# Patient Record
Sex: Female | Born: 1965 | Race: White | Hispanic: No | Marital: Single | Smoking: Current every day smoker
Health system: Southern US, Community
[De-identification: ages and names within clinical notes are randomized; demographics above are authoritative.]

## PROBLEM LIST (undated history)

## (undated) DIAGNOSIS — F329 Major depressive disorder, single episode, unspecified: Secondary | ICD-10-CM

## (undated) DIAGNOSIS — N3010 Interstitial cystitis (chronic) without hematuria: Secondary | ICD-10-CM

## (undated) DIAGNOSIS — F319 Bipolar disorder, unspecified: Secondary | ICD-10-CM

## (undated) DIAGNOSIS — F32A Depression, unspecified: Secondary | ICD-10-CM

## (undated) HISTORY — DX: Major depressive disorder, single episode, unspecified: F32.9

## (undated) HISTORY — PX: FEMUR FRACTURE SURGERY: SHX633

## (undated) HISTORY — DX: Depression, unspecified: F32.A

## (undated) HISTORY — DX: Interstitial cystitis (chronic) without hematuria: N30.10

## (undated) HISTORY — PX: ABDOMINAL HYSTERECTOMY: SHX81

## (undated) HISTORY — DX: Bipolar disorder, unspecified: F31.9

## (undated) HISTORY — PX: TONSILLECTOMY: SUR1361

---

## 1998-08-07 ENCOUNTER — Encounter: Payer: Self-pay | Admitting: Orthopedic Surgery

## 1998-08-08 ENCOUNTER — Ambulatory Visit (HOSPITAL_COMMUNITY): Admission: RE | Admit: 1998-08-08 | Discharge: 1998-08-08 | Payer: Self-pay | Admitting: Orthopedic Surgery

## 1999-12-13 ENCOUNTER — Other Ambulatory Visit: Admission: RE | Admit: 1999-12-13 | Discharge: 1999-12-13 | Payer: Self-pay | Admitting: Gynecology

## 2000-05-06 ENCOUNTER — Encounter: Payer: Self-pay | Admitting: Family Medicine

## 2000-05-06 ENCOUNTER — Ambulatory Visit (HOSPITAL_COMMUNITY): Admission: RE | Admit: 2000-05-06 | Discharge: 2000-05-06 | Payer: Self-pay | Admitting: Family Medicine

## 2004-11-04 HISTORY — PX: CHOLECYSTECTOMY: SHX55

## 2006-09-01 ENCOUNTER — Emergency Department (HOSPITAL_COMMUNITY): Admission: EM | Admit: 2006-09-01 | Discharge: 2006-09-01 | Payer: Self-pay | Admitting: Emergency Medicine

## 2006-09-17 ENCOUNTER — Emergency Department (HOSPITAL_COMMUNITY): Admission: EM | Admit: 2006-09-17 | Discharge: 2006-09-18 | Payer: Self-pay | Admitting: Emergency Medicine

## 2006-09-28 ENCOUNTER — Inpatient Hospital Stay (HOSPITAL_COMMUNITY): Admission: EM | Admit: 2006-09-28 | Discharge: 2006-10-02 | Payer: Self-pay | Admitting: Emergency Medicine

## 2006-09-29 ENCOUNTER — Ambulatory Visit: Payer: Self-pay | Admitting: Infectious Diseases

## 2006-10-02 ENCOUNTER — Encounter (INDEPENDENT_AMBULATORY_CARE_PROVIDER_SITE_OTHER): Payer: Self-pay | Admitting: *Deleted

## 2006-10-02 ENCOUNTER — Encounter (INDEPENDENT_AMBULATORY_CARE_PROVIDER_SITE_OTHER): Payer: Self-pay | Admitting: Otolaryngology

## 2006-10-20 ENCOUNTER — Emergency Department (HOSPITAL_COMMUNITY): Admission: EM | Admit: 2006-10-20 | Discharge: 2006-10-20 | Payer: Self-pay | Admitting: Emergency Medicine

## 2007-02-12 ENCOUNTER — Emergency Department (HOSPITAL_COMMUNITY): Admission: EM | Admit: 2007-02-12 | Discharge: 2007-02-12 | Payer: Self-pay | Admitting: Emergency Medicine

## 2007-03-02 ENCOUNTER — Emergency Department (HOSPITAL_COMMUNITY): Admission: EM | Admit: 2007-03-02 | Discharge: 2007-03-02 | Payer: Self-pay | Admitting: Emergency Medicine

## 2007-03-19 ENCOUNTER — Emergency Department (HOSPITAL_COMMUNITY): Admission: EM | Admit: 2007-03-19 | Discharge: 2007-03-19 | Payer: Self-pay | Admitting: Emergency Medicine

## 2007-11-28 ENCOUNTER — Emergency Department (HOSPITAL_COMMUNITY): Admission: EM | Admit: 2007-11-28 | Discharge: 2007-11-28 | Payer: Self-pay | Admitting: Emergency Medicine
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
## 2011-03-22 NOTE — Op Note (Signed)
[name] [name], [name] [name]               ACCOUNT NO.:  [id number]   MEDICAL RECORD NO.:  [id number]          PATIENT TYPE:  INP   LOCATION:  5735                         FACILITY:  [facility]   PHYSICIAN:  Antony Contras, MD     DATE OF BIRTH:  1966-01-05   DATE OF PROCEDURE:  09/30/2006  DATE OF DISCHARGE:                               OPERATIVE REPORT   PREOPERATIVE DIAGNOSIS:  Right tongue base mass.   POSTOPERATIVE DIAGNOSIS:  Right tongue base mass.   PROCEDURE:  Fiberoptic nasopharyngoscopy.   SURGEON:  Dr. Christia Reading.   ANESTHESIA:  Topical.   COMPLICATIONS:  None.   INDICATIONS:  The patient is a 45 year old white female with a several-  week history of trismus and jaw pain associated with sore throat who was  found to have a right tongue base mass by CT scan.  Evaluation is being  performed by fiberoptic examination due to gag and poor visualization  through the mouth.   FINDINGS:  The right tongue base is more prominent than the left  protruding out into the pharynx somewhat.  There is no ulceration of the  tongue base.  The pharyngeal airway is widely patent.  Tongue mobility  is normal.  Laryngeal examination shows normal vocal cord movement and  no mass felt on the larynx or hypopharynx.   DESCRIPTION OF PROCEDURE:  The patient was identified in the hospital  room and informed consent having been obtained, the right nose was  sprayed with topical lidocaine.  The fiberoptic laryngoscope was then  inserted through the right nasal passage and used to view the  nasopharynx, pharynx, and larynx.  Findings noted above.  After this,  the laryngoscope was removed and the patient returned to nursing care.      Antony Contras, MD  Electronically Signed     DDB/MEDQ  D:  09/30/2006  T:  10/01/2006  Job:  [id number]

## 2011-03-22 NOTE — Consult Note (Signed)
NAMESHERRE, WOOTON               ACCOUNT NO.:  [id number]   MEDICAL RECORD NO.:  [id number]          PATIENT TYPE:  INP   LOCATION:  5735                         FACILITY:  [facility]   PHYSICIAN:  Antony Contras, MD     DATE OF BIRTH:  14-Apr-1966   DATE OF CONSULTATION:  09/30/2006  DATE OF DISCHARGE:                                 CONSULTATION   REQUESTING SERVICE:  [facility], Hind IEda Paschal, MD   CHIEF COMPLAINT:  Jaw pain and trismus.   HISTORY OF PRESENT ILLNESS:  The patient is a 45 year old white female  who has 2-1/2-week history of jaw pain and difficulty opening the jaw.  She relates a story of falling 4-1/2 weeks ago over a rusty fence,  cutting her leg and foot.  She managed the wound and bandaged it and it  healed.  The jaw pain began about 2 weeks later on both sides her jaw  and her throat.  She felt swelling in her throat and her jaw locked 2  days later.  She also had cramping of her legs, feet, and arms, and her  neck hurt as well.  She with her doctor, who treated with a tetanus  booster and Rocephin and also an oral antibiotic.  The antibiotics  seemed to help some but did not get rid of the symptoms completely.  Because of continued pain and a difficulty opening her jaw, she  presented to the emergency department 2 days ago and was admitted to the  hospital.  She was initially treated for presumed tetanus but upon  further evaluation and laboratory testing is not felt to have tetanus.  She still has a hard time opening her jaw and it hurts and pulls in her  cheeks and jaw when she opens further.  She has swelling of her lymph  nodes in her neck.  Her throat has been sore for 2-1/2 weeks but not  severely.  Strep testing was negative.  She has no obstruction through  her throat to breathing or swallowing.  She has some soreness with  swallowing.  Her ears feel stopped up but are not painful.  Consultation  is requested following findings on the CT scan of the neck  performed  yesterday.   PAST MEDICAL HISTORY:  1. Bipolar disorder.  2. Kidney disease.   PAST SURGICAL HISTORY:  Tonsillectomy, cholecystectomy, hysterectomy,  and right knee surgery.   MEDICATIONS:  1. Rocephin.  2. Metronidazole.  3. Nicotine.  4. Zoloft.  5. Geodon.  6. Tylenol.  7. Benadryl.  8. Ativan.  9. Reglan.  10.Oxycodone.   ALLERGIES:  No known drug allergies.   FAMILY HISTORY:  Positive for hypertension, diabetes, heart disease,  osteoporosis, and cancer of the throat and liver or pancreas.   SOCIAL HISTORY:  The patient smokes a pack of cigarettes per day and has  for 22 years.  She denies marijuana or other drug use.  She drinks  occasional alcohol, about once per 2 weeks.   REVIEW OF SYSTEMS:  She has had some chest discomfort, feeling like she  cannot get  her breath.  Her kidneys were bothering her at admission with  blood and cloudiness of her urine, but that this has cleared.  She has  no other complaints.   PHYSICAL EXAMINATION:  VITAL SIGNS:  Afebrile.  Vital signs stable.  GENERAL:  The patient is alert and in no acute distress.  She is  pleasant and cooperative.  Voice is normal and she is and has no  respiratory distress.  HEENT:  Ears:  External ears are normal.  Nose:  External nose is  normal.  Nasal passages are patent.  Septum is relatively midline.  Eyes:  Extraocular movements are intact, and the pupils are equal, round  and reactive to light.  Mouth:  She has moderate trismus.  Buccal,  palatal and floor of mouth mucosa are all normal with no lesions.  The  dentition is good.  The oral tongue is normal and soft to palpation.  It  has normal movement.  As far back as can be palpated on the tongue is  normal.  NEUROLOGIC:  Cranial nerves II-XII are grossly intact.  NECK:  She has tender lymphadenopathy of the zone 2 region on both sides  with slightly enlarged lymph nodes.  Remainder of the neck is without  mass or tenderness.    RADIOLOGY:  A CT scan of the neck with contrast was performed on  November 26 and personally reviewed.  This demonstrates a2.7 x 1.5 cm  mass of the right tongue base, extending into the vallecula, with  possible erosion of the hyoid bone slightly on the right side.  Enlarged  lymph nodes were seen in zone 2 on both sides with the right-sided lymph  node measuring 15 x 10 mm.   ASSESSMENT:  The patient is a 45 year old white female with jaw pain and  trismus and a tongue base mass by CT scan.   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  To do a more thorough  examination and perform a biopsy, she will be scheduled for panendoscopy  under general anesthesia with a plan of a biopsy being performed.  This  is scheduled for November 29 at 1:30 in the afternoon.  Treatment  options will depend on the results of this examination.  Prior to this,  a fiberoptic examination will be performed to get a better idea of what  could be occurring with the tongue base.      Antony Contras, MD  Electronically Signed     DDB/MEDQ  D:  09/30/2006  T:  10/01/2006  Job:  [id number]
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
## 2011-03-22 NOTE — Op Note (Signed)
[name] [name], [name] [name]               ACCOUNT NO.:  [id number]   MEDICAL RECORD NO.:  [id number]          PATIENT TYPE:  INP   LOCATION:  5735                         FACILITY:  [facility]   PHYSICIAN:  Antony Contras, MD     DATE OF BIRTH:  22-Oct-1966   DATE OF PROCEDURE:  10/02/2006  DATE OF DISCHARGE:  10/02/2006                               OPERATIVE REPORT   PREOPERATIVE DIAGNOSIS:  Right tongue base mass.   POSTOPERATIVE DIAGNOSIS:  Right tongue base mass.   PROCEDURE:  1. Direct laryngoscopy with biopsy.  2. Rigid bronchoscopy.  3. Rigid esophagoscopy.   SURGEON:  Excell Seltzer. Jenne Pane, M.D.   ANESTHESIA:  General endotracheal anesthesia.   COMPLICATIONS:  None.   INDICATIONS:  The patient is a 45 year old white female who was admitted  to the hospital a few days ago because of trismus and concern for  tetanus.  In evaluation of other possible causes for her trismus, a CT  scan was performed of the face and neck and demonstrates a 2.7 x 1.5 cm  right tongue base mass.  A fiberoptic examination shows prominence of  the right tongue base.  She presents to the operating room for  panendoscopy with biopsy.   FINDINGS:  The patient was found have a prominent right tongue base  without ulceration.  By palpation, it was felt a bit firm but not rock  hard.  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  Deeper biopsies were performed of the prominent  area.  Frozen section initially showed tonsil tissue.  Some will be sent  for lymphoma protocol, as well.  Upon bronchoscopy, an accessory right  bronchus was noted.  The remainder of the bronchoscopy was normal.  Upon  esophagoscopy, the esophagus was normal.   DESCRIPTION OF PROCEDURE:  The patient was identified in the holding  room, informed consent having been obtained, the patient was taken to  the operating suite and placed on the operating table in the supine  position.  Anesthesia was  induced and the patient was turned 90 degrees  from anesthesia under mask ventilation.  The eyes were taped closed and  a tooth guard was placed.  The larynx was exposed using a #2 Miller  laryngoscope.  A large rigid 0 degree telescope was inserted through the  laryngoscope and between the glottis.  It was used to then evaluate the  trachea as well as both primary bronchi and the takeoff of the secondary  bronchi.  Findings were noted above.  The telescope was then removed and  then a 7 endotracheal tube was inserted without difficulty through the  glottis.  The cuff was inflated and attached to the anesthesia circuit  and the patient successfully ventilated.  This was taped to the left  side of the mouth.  At this point, the larynx and pharynx were carefully  examined with the anterior commissure laryngoscope including the tongue  base, vallecula, piriform sinuses, hypopharynx, and the larynx.  Findings are noted above.  Several biopsies were then taken from  the  right tongue base using cup forceps.  The laryngoscope was then removed.  A #7 rigid esophagoscope was then carefully inserted through the mouth  and into the upper esophagus.  It was then passed down the esophagus  keeping the lumen in view.  It was then gradually backed out slowly,  examining the esophagus for 360 degrees.  After this, the frozen section  results returned as tonsillar tissue.  Thus, the right tongue base was  again exposed with the laryngoscope and blood suctioned out.  Additional  biopsies were then taken and some sent for frozen section and some held  aside for flow cytometry.  The throat was then suctioned out again and  the laryngoscope removed.  The tooth guard was removed and the patient  was turned back to anesthesia for wake-up.  She was extubated and moved  to the recovery room in stable condition.      Antony Contras, MD  Electronically Signed     DDB/MEDQ  D:  10/02/2006  T:  10/02/2006   Job:  [id number]

## 2015-09-08 ENCOUNTER — Encounter: Payer: Self-pay | Admitting: Family Medicine

## 2015-09-08 ENCOUNTER — Ambulatory Visit (INDEPENDENT_AMBULATORY_CARE_PROVIDER_SITE_OTHER): Payer: No Typology Code available for payment source | Admitting: Family Medicine

## 2015-09-08 VITALS — BP 128/81 | HR 86 | Temp 97.5°F | Ht 62.0 in | Wt 155.0 lb

## 2015-09-08 DIAGNOSIS — B3731 Acute candidiasis of vulva and vagina: Secondary | ICD-10-CM

## 2015-09-08 DIAGNOSIS — Z720 Tobacco use: Secondary | ICD-10-CM

## 2015-09-08 DIAGNOSIS — A499 Bacterial infection, unspecified: Secondary | ICD-10-CM

## 2015-09-08 DIAGNOSIS — B373 Candidiasis of vulva and vagina: Secondary | ICD-10-CM

## 2015-09-08 DIAGNOSIS — J209 Acute bronchitis, unspecified: Secondary | ICD-10-CM | POA: Diagnosis not present

## 2015-09-08 DIAGNOSIS — R350 Frequency of micturition: Secondary | ICD-10-CM | POA: Diagnosis not present

## 2015-09-08 DIAGNOSIS — N760 Acute vaginitis: Secondary | ICD-10-CM

## 2015-09-08 DIAGNOSIS — B9689 Other specified bacterial agents as the cause of diseases classified elsewhere: Secondary | ICD-10-CM

## 2015-09-08 DIAGNOSIS — Z716 Tobacco abuse counseling: Secondary | ICD-10-CM | POA: Diagnosis not present

## 2015-09-08 DIAGNOSIS — F319 Bipolar disorder, unspecified: Secondary | ICD-10-CM | POA: Insufficient documentation

## 2015-09-08 LAB — POCT URINALYSIS DIPSTICK
Bilirubin, UA: NEGATIVE
GLUCOSE UA: NEGATIVE
Ketones, UA: NEGATIVE
Leukocytes, UA: NEGATIVE
NITRITE UA: NEGATIVE
PROTEIN UA: NEGATIVE
RBC UA: NEGATIVE
Spec Grav, UA: 1.02
UROBILINOGEN UA: NEGATIVE
pH, UA: 6.5

## 2015-09-08 LAB — POCT UA - MICROSCOPIC ONLY
Casts, Ur, LPF, POC: NEGATIVE
Crystals, Ur, HPF, POC: NEGATIVE
RBC, URINE, MICROSCOPIC: NEGATIVE
WBC, Ur, HPF, POC: NEGATIVE
Yeast, UA: NEGATIVE

## 2015-09-08 MED ORDER — METRONIDAZOLE 500 MG PO TABS: 500.0000 mg | ORAL_TABLET | Freq: Two times a day (BID) | ORAL | Status: DC

## 2015-09-08 MED ORDER — ALBUTEROL SULFATE HFA 108 (90 BASE) MCG/ACT IN AERS: 2.0000 | INHALATION_SPRAY | Freq: Four times a day (QID) | RESPIRATORY_TRACT | Status: DC | PRN

## 2015-09-08 MED ORDER — FLUCONAZOLE 150 MG PO TABS: 150.0000 mg | ORAL_TABLET | Freq: Once | ORAL | Status: DC

## 2015-09-08 MED ORDER — BUPROPION HCL ER (XL) 150 MG PO TB24: 150.0000 mg | ORAL_TABLET | Freq: Every day | ORAL | Status: DC

## 2015-09-08 NOTE — Progress Notes (Signed)
BP 128/81 mmHg  Pulse 86  Temp(Src) 97.5 F (36.4 C) (Oral)  Ht 5\' 2"  (1.575 m)  Wt 155 lb (70.308 kg)  BMI 28.34 kg/m2   Subjective:    Patient ID: [name] [name], female    DOB: 26-Oct-1966, 49 y.o.   MRN: [id number]  HPI: [name] [name] is a 49 y.o. female presenting on 09/08/2015 for Urinary Tract Infection; Vaginitis; and Medication Refill   HPI Dysuria Patient has been having dysuria and suprapubic abdominal pressure that began 2 weeks ago and has been worsening over the past couple days. She is also been complaining of some vaginal odor and discharge and took some home Diflucan but she is also had BV previously. She denies fevers or chills, she denies any flank pain, she denies any nausea or vomiting.  Relevant past medical, surgical, family and social history reviewed and updated as indicated. Interim medical history since our last visit reviewed. Allergies and medications reviewed and updated.  Review of Systems  Constitutional: Negative for fever and chills.  HENT: Negative for congestion, ear discharge and ear pain.   Eyes: Negative for redness and visual disturbance.  Respiratory: Negative for chest tightness and shortness of breath.   Cardiovascular: Negative for chest pain and leg swelling.  Gastrointestinal: Positive for abdominal pain. Negative for abdominal distention.  Genitourinary: Positive for dysuria, urgency, frequency and vaginal discharge (with vaginal odor). Negative for hematuria, flank pain, decreased urine volume, vaginal bleeding, difficulty urinating, vaginal pain, menstrual problem and pelvic pain.  Musculoskeletal: Negative for back pain and gait problem.  Skin: Negative for rash.  Neurological: Negative for dizziness, light-headedness and headaches.  Psychiatric/Behavioral: Negative for behavioral problems and agitation.  All other systems reviewed and are negative.   Per HPI unless specifically indicated above  Social History    Social History  . Marital Status: Single    Spouse Name: N/A  . Number of Children: N/A  . Years of Education: N/A   Occupational History  . Not on file.   Social History Main Topics  . Smoking status: Current Every Day Smoker -- 0.50 packs/day for 33 years    Types: Cigarettes  . Smokeless tobacco: Never Used  . Alcohol Use: 0.0 oz/week    0 Standard drinks or equivalent per week     Comment: once per month  . Drug Use: No  . Sexual Activity: Yes    Birth Control/ Protection: Condom     Comment: boyfriend   Other Topics Concern  . Not on file   Social History Narrative  . No narrative on file    Past Surgical History  Procedure Laterality Date  . Abdominal hysterectomy    . Femur fracture surgery    . Tonsillectomy    . Cholecystectomy  2006    Family History  Problem Relation Age of Onset  . Hypertension Mother   . Hypertension Father   . Heart disease Father   . Diabetes Father   . Bipolar disorder Daughter   . ADD / ADHD Son       Medication List       This list is accurate as of: 09/08/15  2:39 PM.  Always use your most recent med list.               albuterol 108 (90 BASE) MCG/ACT inhaler  Commonly known as:  PROVENTIL HFA;VENTOLIN HFA  Inhale 2 puffs into the lungs every 6 (six) hours as needed for wheezing or shortness of  breath.     buPROPion 150 MG 24 hr tablet  Commonly known as:  WELLBUTRIN XL  Take 1 tablet (150 mg total) by mouth daily.     fluconazole 150 MG tablet  Commonly known as:  DIFLUCAN  Take 1 tablet (150 mg total) by mouth once.     metroNIDAZOLE 500 MG tablet  Commonly known as:  FLAGYL  Take 1 tablet (500 mg total) by mouth 2 (two) times daily.           Objective:    BP 128/81 mmHg  Pulse 86  Temp(Src) 97.5 F (36.4 C) (Oral)  Ht  (1.575 m)  Wt 155 lb (70.308 kg)  BMI 28.34 kg/m2  Wt Readings from Last 3 Encounters:  09/08/15 155 lb (70.308 kg)    Physical Exam  Constitutional: She is oriented  to person, place, and time. She appears well-developed and well-nourished. No distress.  Eyes: Conjunctivae and EOM are normal. Pupils are equal, round, and reactive to light.  Cardiovascular: Normal rate, regular rhythm, normal heart sounds and intact distal pulses.   No murmur heard. Pulmonary/Chest: Effort normal and breath sounds normal. No respiratory distress. She has no wheezes.  Abdominal: Soft. Bowel sounds are normal. There is tenderness (Suprapubic with no flank tenderness). There is no rebound and no guarding.  Musculoskeletal: Normal range of motion. She exhibits no edema or tenderness.  Neurological: She is alert and oriented to person, place, and time. Coordination normal.  Skin: Skin is warm and dry. No rash noted. She is not diaphoretic.  Psychiatric: She has a normal mood and affect. Her behavior is normal.  Nursing note and vitals reviewed.   Results for orders placed or performed in visit on 09/08/15  POCT UA - Microscopic Only  Result Value Ref Range   WBC, Ur, HPF, POC neg    RBC, urine, microscopic neg    Bacteria, U Microscopic occ    Mucus, UA occ    Epithelial cells, urine per micros many    Crystals, Ur, HPF, POC neg    Casts, Ur, LPF, POC neg    Yeast, UA neg   POCT urinalysis dipstick  Result Value Ref Range   Color, UA gold    Clarity, UA clear    Glucose, UA neg    Bilirubin, UA neg    Ketones, UA neg    Spec Grav, UA 1.020    Blood, UA neg    pH, UA 6.5    Protein, UA neg    Urobilinogen, UA negative    Nitrite, UA neg    Leukocytes, UA Negative Negative      Assessment & Plan:   Problem List Items Addressed This Visit    None    Visit Diagnoses    Urinary frequency    -  Primary    Relevant Orders    POCT UA - Microscopic Only (Completed)    POCT urinalysis dipstick (Completed)    Acute bronchitis, unspecified organism        Relevant Medications    albuterol (PROVENTIL HFA;VENTOLIN HFA) 108 (90 BASE) MCG/ACT inhaler    Yeast  vaginitis        Relevant Medications    fluconazole (DIFLUCAN) 150 MG tablet    metroNIDAZOLE (FLAGYL) 500 MG tablet    Bacterial vaginosis        Relevant Medications    fluconazole (DIFLUCAN) 150 MG tablet    metroNIDAZOLE (FLAGYL) 500 MG tablet    Encounter  for smoking cessation counseling        Relevant Medications    buPROPion (WELLBUTRIN XL) 150 MG 24 hr tablet        Follow up plan: Return in about 2 weeks (around 09/22/2015), or if symptoms worsen or fail to improve, for WWE and PAP.  Arville Care, MD [facility] 09/08/2015, 2:39 PM

## 2015-09-22 ENCOUNTER — Ambulatory Visit (INDEPENDENT_AMBULATORY_CARE_PROVIDER_SITE_OTHER): Payer: No Typology Code available for payment source | Admitting: Family Medicine

## 2015-09-22 ENCOUNTER — Encounter: Payer: Self-pay | Admitting: Family Medicine

## 2015-09-22 VITALS — BP 110/77 | HR 89 | Temp 98.6°F | Ht 66.0 in | Wt 151.4 lb

## 2015-09-22 DIAGNOSIS — N898 Other specified noninflammatory disorders of vagina: Secondary | ICD-10-CM

## 2015-09-22 DIAGNOSIS — Z01419 Encounter for gynecological examination (general) (routine) without abnormal findings: Secondary | ICD-10-CM | POA: Diagnosis not present

## 2015-09-22 DIAGNOSIS — Z13220 Encounter for screening for lipoid disorders: Secondary | ICD-10-CM

## 2015-09-22 DIAGNOSIS — Z79890 Hormone replacement therapy (postmenopausal): Secondary | ICD-10-CM | POA: Diagnosis not present

## 2015-09-22 DIAGNOSIS — Z131 Encounter for screening for diabetes mellitus: Secondary | ICD-10-CM

## 2015-09-22 LAB — POCT WET PREP WITH KOH
CLUE CELLS WET PREP PER HPF POC: NEGATIVE
RBC WET PREP PER HPF POC: NEGATIVE
TRICHOMONAS UA: NEGATIVE
WBC Wet Prep HPF POC: NEGATIVE
Yeast Wet Prep HPF POC: NEGATIVE

## 2015-09-22 MED ORDER — ESTROGENS, CONJUGATED 0.625 MG/GM VA CREA: 1.0000 | TOPICAL_CREAM | Freq: Every day | VAGINAL | Status: DC

## 2015-09-22 MED ORDER — ESTROGENS CONJUGATED 0.625 MG PO TABS: 0.6250 mg | ORAL_TABLET | Freq: Every day | ORAL | Status: DC

## 2015-09-22 NOTE — Progress Notes (Signed)
BP 110/77 mmHg  Pulse 89  Temp(Src) 98.6 F (37 C) (Oral)  Ht _0  (1.676 m)  Wt 151 lb 6.4 oz (68.675 kg)  BMI 24.45 kg/m2   Subjective:    Patient ID: [name] [name], female    DOB: 01/01/1966, 49 y.o.   MRN: [id number]  HPI: [name] is a 49 y.o. female presenting on 09/22/2015 for Gynecologic Exam   HPI Well woman exam Patient comes in today for her routine well woman exam and Pap smear. She has not had a Pap smear in quite a few years. She does admit that she's had a hysterectomy when she was age 35. Over the past few years she has been having issues with persistent vaginal irritation and dryness and infections. She was treated most recently by [place] about a month ago for BV and [place] and it improved but did not fully clear. She denies any vaginal bleeding. She has been on hormone replacement therapy before and would like to go back on that. She has been having hot flashes and vaginal irritation and dryness. She is also not able to sleep as that as well as she used to.  Relevant past medical, surgical, family and social history reviewed and updated as indicated. Interim medical history since our last visit reviewed. Allergies and medications reviewed and updated.  Review of Systems  Constitutional: Negative for fever and chills.  HENT: Negative for congestion, ear discharge and ear pain.   Eyes: Negative for redness and visual disturbance.  Respiratory: Negative for chest tightness and shortness of breath.   Cardiovascular: Negative for chest pain and leg swelling.  Gastrointestinal: Negative for nausea, vomiting, abdominal pain, diarrhea, constipation, blood in stool and abdominal distention.  Endocrine: Positive for heat intolerance (hot flashes).  Genitourinary: Positive for vaginal discharge and vaginal pain. Negative for dysuria, urgency, hematuria, decreased urine volume, vaginal bleeding, difficulty urinating and genital sores.  Musculoskeletal: Negative  for back pain and gait problem.  Skin: Negative for rash.  Neurological: Negative for dizziness, light-headedness and headaches.  Psychiatric/Behavioral: Negative for behavioral problems and agitation.  All other systems reviewed and are negative.   Per HPI unless specifically indicated above     Medication List       This list is accurate as of: 09/22/15 12:20 PM.  Always use your most recent med list.               albuterol 108 (90 BASE) MCG/ACT inhaler  Commonly known as:  PROVENTIL HFA;VENTOLIN HFA  Inhale 2 puffs into the lungs every 6 (six) hours as needed for wheezing or shortness of breath.     buPROPion 150 MG 24 hr tablet  Commonly known as:  WELLBUTRIN XL  Take 1 tablet (150 mg total) by mouth daily.     conjugated estrogens vaginal cream  Commonly known as:  PREMARIN  Place 1 Applicatorful vaginally daily.     estrogens (conjugated) 0.625 MG tablet  Commonly known as:  PREMARIN  Take 1 tablet (0.625 mg total) by mouth daily. Take daily for 21 days then do not take for 7 days.           Objective:    BP 110/77 mmHg  Pulse 89  Temp(Src) 98.6 F (37 C) (Oral)  Ht _1  (1.676 m)  Wt 151 lb 6.4 oz (68.675 kg)  BMI 24.45 kg/m2  Wt Readings from Last 3 Encounters:  09/22/15 151 lb 6.4 oz (68.675 kg)  09/08/15 155 lb (  70.308 kg)    Physical Exam  Constitutional: She is oriented to person, place, and time. She appears well-developed and well-nourished. No distress.  Eyes: Conjunctivae and EOM are normal. Pupils are equal, round, and reactive to light.  Neck: Neck supple. No thyromegaly present.  Cardiovascular: Normal rate, regular rhythm, normal heart sounds and intact distal pulses.   No murmur heard. Pulmonary/Chest: Effort normal and breath sounds normal. No respiratory distress. She has no wheezes. Right breast exhibits no inverted nipple, no mass, no nipple discharge, no skin change and no tenderness. Left breast exhibits no inverted nipple, no  mass, no nipple discharge, no skin change and no tenderness. Breasts are symmetrical.  Abdominal: Soft. Bowel sounds are normal. She exhibits no distension. There is no tenderness. There is no rebound and no guarding.  Genitourinary: No breast swelling, tenderness, discharge or bleeding. Pelvic exam was performed with patient supine. There is no rash or lesion on the right labia. There is no rash or lesion on the left labia. There is erythema and tenderness in the vagina. No bleeding in the vagina. No vaginal discharge found.  Cervix is absent and uterus is absent surgically, blind pouch looks intact and well-healed.  Musculoskeletal: Normal range of motion. She exhibits no edema.  Lymphadenopathy:    She has no cervical adenopathy.    She has no axillary adenopathy.  Neurological: She is alert and oriented to person, place, and time. Coordination normal.  Skin: Skin is warm and dry. No rash noted. She is not diaphoretic.  Psychiatric: She has a normal mood and affect. Her behavior is normal.  Vitals reviewed.   Results for orders placed or performed in visit on 09/08/15  POCT UA - Microscopic Only  Result Value Ref Range   WBC, Ur, HPF, POC neg    RBC, urine, microscopic neg    Bacteria, U Microscopic occ    Mucus, UA occ    Epithelial cells, urine per micros many    Crystals, Ur, HPF, POC neg    Casts, Ur, LPF, POC neg    Yeast, UA neg   POCT urinalysis dipstick  Result Value Ref Range   Color, UA gold    Clarity, UA clear    Glucose, UA neg    Bilirubin, UA neg    Ketones, UA neg    Spec Grav, UA 1.020    Blood, UA neg    pH, UA 6.5    Protein, UA neg    Urobilinogen, UA negative    Nitrite, UA neg    Leukocytes, UA Negative Negative      Assessment & Plan:   Problem List Items Addressed This Visit    None    Visit Diagnoses    Well woman exam with routine gynecological exam    -  Primary    Relevant Orders    Pap IG, rfx HPV all pth    Vaginal discharge         Relevant Orders    POCT Wet Prep with KOH (Completed)    Screening for diabetes mellitus        Relevant Orders    CMP14+EGFR    Screening, lipid        Relevant Orders    Lipid panel    Postmenopausal hormone replacement therapy        Relevant Medications    estrogens, conjugated, (PREMARIN) 0.625 MG tablet    conjugated estrogens (PREMARIN) vaginal cream  Follow up plan: Return in about 6 months (around 03/21/2016), or if symptoms worsen or fail to improve, for f/u vaginal irritation.  Counseling provided for all of the vaccine components Orders Placed This Encounter  Procedures  . CMP14+EGFR  . Lipid panel  . POCT Wet Prep with KOH    Caryl Pina, MD [facility] 09/22/2015, 12:20 PM

## 2015-09-26 ENCOUNTER — Telehealth: Payer: Self-pay | Admitting: *Deleted

## 2015-09-26 LAB — PAP IG, RFX HPV ALL PTH: PAP Smear Comment: 0

## 2015-09-26 MED ORDER — METRONIDAZOLE 0.75 % VA GEL: 1.0000 | Freq: Two times a day (BID) | VAGINAL | Status: DC

## 2015-09-26 NOTE — Addendum Note (Signed)
Addended by: Tamera PuntWRAY, WENDY S on: 09/26/2015 08:35 AM   Modules accepted: Orders

## 2015-09-26 NOTE — Telephone Encounter (Signed)
Patient states that she thought you were going to give her a vaginal cream for dryness. Please advise

## 2015-09-27 NOTE — Progress Notes (Signed)
Patient aware.

## 2015-09-27 NOTE — Telephone Encounter (Signed)
Patient informed to contact her insurance company to find out what vaginal cream is on their preferred list and let us know so we can send this in instead of the Premarin Cream

## 2015-09-27 NOTE — Telephone Encounter (Signed)
I did send the conjugated estrogen vaginal cream, Premarin cream, shows up in my note, now if she didn't get it that may be an insurance problem. Let me know Arville CareJoshua Johnhenry Tippin, MD [facility] 09/27/2015, 7:59 AM

## 2015-09-27 NOTE — Telephone Encounter (Signed)
Contacted [facility], prescription is there but cost is $336.29.  Patient needs to contact her insurance company to find out what their preferred drug is for vaginal dryness.  Contacted patient and left message to call back.

## 2015-10-23 ENCOUNTER — Ambulatory Visit: Payer: No Typology Code available for payment source | Admitting: Family Medicine

## 2015-10-24 ENCOUNTER — Encounter: Payer: Self-pay | Admitting: Family Medicine

## 2015-10-26 ENCOUNTER — Ambulatory Visit (INDEPENDENT_AMBULATORY_CARE_PROVIDER_SITE_OTHER): Payer: No Typology Code available for payment source | Admitting: Family Medicine

## 2015-10-26 ENCOUNTER — Encounter: Payer: Self-pay | Admitting: Family Medicine

## 2015-10-26 VITALS — BP 108/78 | HR 118 | Temp 97.1°F | Ht 66.0 in | Wt 148.8 lb

## 2015-10-26 DIAGNOSIS — R109 Unspecified abdominal pain: Secondary | ICD-10-CM

## 2015-10-26 DIAGNOSIS — N898 Other specified noninflammatory disorders of vagina: Secondary | ICD-10-CM

## 2015-10-26 MED ORDER — HYDROCODONE-ACETAMINOPHEN 5-325 MG PO TABS: 1.0000 | ORAL_TABLET | Freq: Three times a day (TID) | ORAL | Status: DC | PRN

## 2015-10-26 MED ORDER — METRONIDAZOLE 500 MG PO TABS: 500.0000 mg | ORAL_TABLET | Freq: Two times a day (BID) | ORAL | Status: DC

## 2015-10-26 MED ORDER — FLUCONAZOLE 150 MG PO TABS: 150.0000 mg | ORAL_TABLET | Freq: Once | ORAL | Status: DC

## 2015-10-26 NOTE — Progress Notes (Signed)
BP 108/78 mmHg  Pulse 118  Temp(Src) 97.1 F (36.2 C) (Oral)  Ht  (1.676 m)  Wt 148 lb 12.8 oz (67.495 kg)  BMI 24.03 kg/m2   Subjective:    Patient ID: [name] [name], female    DOB: 11/20/1965, 49 y.o.   MRN: [id number]  HPI: [name] [name] is a 49 y.o. female presenting on 10/26/2015 for Abdominal Pain and Vaginal Discharge   HPI General abdominal pain She has developed general abdominal pain that over the past 2 days. She has had this previously and intermittently. She has had workups for this before. She also had a hysterectomy when she was young. She denies any diarrhea or dysuria or hematuria or constipation or nausea or vomiting. She denies any fevers or chills.  Vaginal discharge Patient is been having thick white vaginal discharge over the past few days. She gets these recurrent issues with vaginal discharge. Last time I saw her we treated her for it and were going to try her on a Premarin cream but she was never able to pick it up because of expense. She also complains of vaginal irritation.  Relevant past medical, surgical, family and social history reviewed and updated as indicated. Interim medical history since our last visit reviewed. Allergies and medications reviewed and updated.  Review of Systems  Constitutional: Negative for fever and chills.  HENT: Negative for congestion, ear discharge and ear pain.   Eyes: Negative for redness and visual disturbance.  Respiratory: Negative for chest tightness and shortness of breath.   Cardiovascular: Negative for chest pain and leg swelling.  Gastrointestinal: Positive for abdominal pain. Negative for nausea, vomiting, diarrhea, constipation, blood in stool, abdominal distention and anal bleeding.  Genitourinary: Positive for vaginal discharge and pelvic pain. Negative for dysuria, frequency, flank pain, vaginal bleeding, difficulty urinating and vaginal pain.  Musculoskeletal: Negative for back pain and  gait problem.  Skin: Negative for rash.  Neurological: Negative for light-headedness and headaches.  Psychiatric/Behavioral: Negative for behavioral problems and agitation.  All other systems reviewed and are negative.   Per HPI unless specifically indicated above     Medication List       This list is accurate as of: 10/26/15 10:52 AM.  Always use your most recent med list.               albuterol 108 (90 BASE) MCG/ACT inhaler  Commonly known as:  PROVENTIL HFA;VENTOLIN HFA  Inhale 2 puffs into the lungs every 6 (six) hours as needed for wheezing or shortness of breath.     buPROPion 150 MG 24 hr tablet  Commonly known as:  WELLBUTRIN XL  Take 1 tablet (150 mg total) by mouth daily.     conjugated estrogens vaginal cream  Commonly known as:  PREMARIN  Place 1 Applicatorful vaginally daily.     estrogens (conjugated) 0.625 MG tablet  Commonly known as:  PREMARIN  Take 1 tablet (0.625 mg total) by mouth daily. Take daily for 21 days then do not take for 7 days.     fluconazole 150 MG tablet  Commonly known as:  DIFLUCAN  Take 1 tablet (150 mg total) by mouth once. Repeat in 7 days     HYDROcodone-acetaminophen 5-325 MG tablet  Commonly known as:  NORCO  Take 1 tablet by mouth every 8 (eight) hours as needed for moderate pain.     metroNIDAZOLE 0.75 % vaginal gel  Commonly known as:  METROGEL  Place 1 Applicatorful vaginally  2 (two) times daily.     metroNIDAZOLE 500 MG tablet  Commonly known as:  FLAGYL  Take 1 tablet (500 mg total) by mouth 2 (two) times daily.           Objective:    BP 108/78 mmHg  Pulse 118  Temp(Src) 97.1 F (36.2 C) (Oral)  Ht 5\' 6"  (1.676 m)  Wt 148 lb 12.8 oz (67.495 kg)  BMI 24.03 kg/m2  Wt Readings from Last 3 Encounters:  10/26/15 148 lb 12.8 oz (67.495 kg)  09/22/15 151 lb 6.4 oz (68.675 kg)  09/08/15 155 lb (70.308 kg)    Physical Exam  Constitutional: She is oriented to person, place, and time. She appears  well-developed and well-nourished. No distress.  Eyes: Conjunctivae and EOM are normal. Pupils are equal, round, and reactive to light.  Cardiovascular: Normal rate, regular rhythm, normal heart sounds and intact distal pulses.   No murmur heard. Pulmonary/Chest: Effort normal and breath sounds normal. No respiratory distress. She has no wheezes.  Abdominal: Soft. Bowel sounds are normal. She exhibits no distension and no mass. There is generalized tenderness. There is no rigidity, no rebound, no guarding, no CVA tenderness, no tenderness at McBurney's point and negative Murphy's sign.  Musculoskeletal: Normal range of motion. She exhibits no edema or tenderness.  Neurological: She is alert and oriented to person, place, and time. Coordination normal.  Skin: Skin is warm and dry. No rash noted. She is not diaphoretic.  Psychiatric: She has a normal mood and affect. Her behavior is normal.  Nursing note and vitals reviewed.   Results for orders placed or performed in visit on 09/22/15  POCT Wet Prep with KOH  Result Value Ref Range   Trichomonas, UA Negative    Clue Cells Wet Prep HPF POC neg    Epithelial Wet Prep HPF POC Few Few, Moderate, Many, Too numerous to count   Yeast Wet Prep HPF POC neg    Bacteria Wet Prep HPF POC Moderate (A) None, Few, Too numerous to count   RBC Wet Prep HPF POC neg    WBC Wet Prep HPF POC neg    KOH Prep POC    Pap IG, rfx HPV all pth  Result Value Ref Range   DIAGNOSIS: Comment    Specimen adequacy: Comment    CLINICIAN PROVIDED ICD10: Comment    Performed by: Comment    PAP SMEAR COMMENT .    Note: Comment    Test Methodology Comment    PAP REFLEX: Comment       Assessment & Plan:   Problem List Items Addressed This Visit    None    Visit Diagnoses    Vaginal discharge    -  Primary    Relevant Medications    metroNIDAZOLE (FLAGYL) 500 MG tablet    fluconazole (DIFLUCAN) 150 MG tablet    Other Relevant Orders    Ambulatory referral to  Obstetrics / Gynecology    Abdominal pain, unspecified abdominal location        Relevant Orders    CT Abdomen Pelvis W Contrast        Follow up plan: Return if symptoms worsen or fail to improve.  Counseling provided for all of the vaccine components Orders Placed This Encounter  Procedures  . CT Abdomen Pelvis W Contrast  . Ambulatory referral to Obstetrics / Gynecology    Arville CareJoshua Dettinger, MD [facility] 10/26/2015, 10:52 AM

## 2015-10-31 ENCOUNTER — Ambulatory Visit (HOSPITAL_COMMUNITY)
Admission: RE | Admit: 2015-10-31 | Discharge: 2015-10-31 | Disposition: A | Discharge status: Home / Self Care | Payer: No Typology Code available for payment source | Source: Ambulatory Visit | Attending: Family Medicine | Admitting: Family Medicine

## 2015-10-31 DIAGNOSIS — Z9049 Acquired absence of other specified parts of digestive tract: Secondary | ICD-10-CM | POA: Insufficient documentation

## 2015-10-31 DIAGNOSIS — N761 Subacute and chronic vaginitis: Secondary | ICD-10-CM | POA: Insufficient documentation

## 2015-10-31 DIAGNOSIS — M545 Low back pain: Secondary | ICD-10-CM | POA: Diagnosis not present

## 2015-10-31 DIAGNOSIS — R102 Pelvic and perineal pain: Secondary | ICD-10-CM | POA: Insufficient documentation

## 2015-10-31 DIAGNOSIS — Z90710 Acquired absence of both cervix and uterus: Secondary | ICD-10-CM | POA: Diagnosis not present

## 2015-10-31 DIAGNOSIS — R197 Diarrhea, unspecified: Secondary | ICD-10-CM | POA: Insufficient documentation

## 2015-10-31 DIAGNOSIS — R109 Unspecified abdominal pain: Secondary | ICD-10-CM

## 2015-10-31 MED ORDER — IOHEXOL 300 MG/ML  SOLN
100.0000 mL | Freq: Once | INTRAMUSCULAR | Status: AC | PRN
  Administered 2015-10-31: 100 mL via INTRAVENOUS

## 2015-11-20 ENCOUNTER — Ambulatory Visit: Payer: No Typology Code available for payment source | Admitting: Family Medicine

## 2015-11-23 ENCOUNTER — Encounter: Payer: Self-pay | Admitting: Family Medicine

## 2015-11-23 ENCOUNTER — Ambulatory Visit (INDEPENDENT_AMBULATORY_CARE_PROVIDER_SITE_OTHER): Payer: No Typology Code available for payment source | Admitting: Family Medicine

## 2015-11-23 VITALS — BP 123/88 | HR 86 | Temp 97.5°F | Ht 66.0 in | Wt 146.4 lb

## 2015-11-23 DIAGNOSIS — N898 Other specified noninflammatory disorders of vagina: Secondary | ICD-10-CM

## 2015-11-23 DIAGNOSIS — J019 Acute sinusitis, unspecified: Secondary | ICD-10-CM | POA: Diagnosis not present

## 2015-11-23 MED ORDER — FLUTICASONE PROPIONATE 50 MCG/ACT NA SUSP: 1.0000 | Freq: Two times a day (BID) | NASAL | Status: DC | PRN

## 2015-11-23 MED ORDER — BENZONATATE 200 MG PO CAPS: 200.0000 mg | ORAL_CAPSULE | Freq: Three times a day (TID) | ORAL | Status: DC | PRN

## 2015-11-23 MED ORDER — HYLAFEM VA SUPP: 1.0000 | Freq: Every day | VAGINAL | Status: DC

## 2015-11-23 MED ORDER — FLUCONAZOLE 150 MG PO TABS: 150.0000 mg | ORAL_TABLET | Freq: Once | ORAL | Status: DC

## 2015-11-23 NOTE — Progress Notes (Signed)
BP 123/88 mmHg  Pulse 86  Temp(Src) 97.5 F (36.4 C) (Oral)  Ht  (1.676 m)  Wt 146 lb 6.4 oz (66.407 kg)  BMI 23.64 kg/m2   Subjective:    Patient ID: [name] [name], female    DOB: 10-15-1966, 50 y.o.   MRN: [id number]  HPI: [name] [name] is a 50 y.o. female presenting on 11/23/2015 for Sinusitis; Chest congestion; Cough; and Vaginal itching and discharge   HPI Recurrent vaginal irritation  Patient has had an issue for quite a few years with recurrent vaginal irritation and yeast infections and BV infections and go back and forth. Since she is been here with [place] we have treated her 2 times previously for this issue. She says it never fully clears and then returns quickly. She has an appointment with OB/GYN in 3 days. She denies any fevers or chills. She does have a small amount of vaginal discharge that is thick and white and she thinks his yeast. She has been sexually active some but not a lot because of this issue that she has been having her currently.  Sinus congestion and cough and chest congestion Patient is been having sinus congestion and cough and nasal congestion and chest congestion for the past 3 weeks. She does admit that she is still smoking. She would like to try some non-antibiotic treatments to help with this because of her vaginal irritation problems she would not like to do an antibiotic at this point. She denies any fevers or chills. She denies any shortness of breath or wheezing. Her cough is mostly nonproductive and she feels like it gets stuck down in her throat and her chest. She also admits to having some sore throat with it but the most significant is the cough. She denies any issues with her ears. She denies any sick contacts that she knows of. She is not ready to quit smoking at  Relevant past medical, surgical, family and social history reviewed and updated as indicated. Interim medical history since our last visit reviewed. Allergies and  medications reviewed and updated.  Review of Systems  Constitutional: Negative for fever and chills.  HENT: Positive for congestion, postnasal drip, rhinorrhea, sinus pressure and sore throat. Negative for ear discharge, ear pain and sneezing.   Eyes: Negative for pain, redness and visual disturbance.  Respiratory: Positive for cough. Negative for chest tightness and shortness of breath.   Cardiovascular: Negative for chest pain and leg swelling.  Gastrointestinal: Positive for abdominal pain. Negative for nausea, vomiting, diarrhea, constipation, blood in stool, abdominal distention and anal bleeding.  Genitourinary: Positive for vaginal discharge and pelvic pain. Negative for dysuria, frequency, flank pain, vaginal bleeding, difficulty urinating and vaginal pain.  Musculoskeletal: Negative for back pain and gait problem.  Skin: Negative for rash.  Neurological: Negative for light-headedness and headaches.  Psychiatric/Behavioral: Negative for behavioral problems and agitation.  All other systems reviewed and are negative.   Per HPI unless specifically indicated above     Medication List       This list is accurate as of: 11/23/15  9:10 AM.  Always use your most recent med list.               albuterol 108 (90 Base) MCG/ACT inhaler  Commonly known as:  PROVENTIL HFA;VENTOLIN HFA  Inhale 2 puffs into the lungs every 6 (six) hours as needed for wheezing or shortness of breath.     benzonatate 200 MG capsule  Commonly known as:  TESSALON  Take 1 capsule (200 mg total) by mouth 3 (three) times daily as needed for cough.     buPROPion 150 MG 24 hr tablet  Commonly known as:  WELLBUTRIN XL  Take 1 tablet (150 mg total) by mouth daily.     conjugated estrogens vaginal cream  Commonly known as:  PREMARIN  Place 1 Applicatorful vaginally daily.     estrogens (conjugated) 0.625 MG tablet  Commonly known as:  PREMARIN  Take 1 tablet (0.625 mg total) by mouth daily. Take daily for  21 days then do not take for 7 days.     fluconazole 150 MG tablet  Commonly known as:  DIFLUCAN  Take 1 tablet (150 mg total) by mouth once. Repeat in 7 days     fluticasone 50 MCG/ACT nasal spray  Commonly known as:  FLONASE  Place 1 spray into both nostrils 2 (two) times daily as needed for allergies or rhinitis.     HYDROcodone-acetaminophen 5-325 MG tablet  Commonly known as:  NORCO  Take 1 tablet by mouth every 8 (eight) hours as needed for moderate pain.     HYLAFEM Supp  Place 1 suppository vaginally daily.     metroNIDAZOLE 0.75 % vaginal gel  Commonly known as:  METROGEL  Place 1 Applicatorful vaginally 2 (two) times daily.     metroNIDAZOLE 500 MG tablet  Commonly known as:  FLAGYL  Take 1 tablet (500 mg total) by mouth 2 (two) times daily.           Objective:    BP 123/88 mmHg  Pulse 86  Temp(Src) 97.5 F (36.4 C) (Oral)  Ht  (1.676 m)  Wt 146 lb 6.4 oz (66.407 kg)  BMI 23.64 kg/m2  Wt Readings from Last 3 Encounters:  11/23/15 146 lb 6.4 oz (66.407 kg)  10/26/15 148 lb 12.8 oz (67.495 kg)  09/22/15 151 lb 6.4 oz (68.675 kg)    Physical Exam  Constitutional: She is oriented to person, place, and time. She appears well-developed and well-nourished. No distress.  HENT:  Right Ear: Tympanic membrane, external ear and ear canal normal.  Left Ear: Tympanic membrane, external ear and ear canal normal.  Nose: Mucosal edema and rhinorrhea present. No epistaxis. Right sinus exhibits no maxillary sinus tenderness and no frontal sinus tenderness. Left sinus exhibits no maxillary sinus tenderness and no frontal sinus tenderness.  Mouth/Throat: Uvula is midline and mucous membranes are normal. Posterior oropharyngeal edema and posterior oropharyngeal erythema present. No oropharyngeal exudate or tonsillar abscesses.  Eyes: Conjunctivae and EOM are normal. Pupils are equal, round, and reactive to light.  Cardiovascular: Normal rate, regular rhythm, normal heart  sounds and intact distal pulses.   No murmur heard. Pulmonary/Chest: Effort normal and breath sounds normal. No respiratory distress. She has no wheezes.  Abdominal: Soft. Bowel sounds are normal. She exhibits no distension and no mass. There is generalized tenderness. There is no rigidity, no rebound, no guarding, no CVA tenderness, no tenderness at McBurney's point and negative Murphy's sign.  Musculoskeletal: Normal range of motion. She exhibits no edema or tenderness.  Neurological: She is alert and oriented to person, place, and time. Coordination normal.  Skin: Skin is warm and dry. No rash noted. She is not diaphoretic.  Psychiatric: She has a normal mood and affect. Her behavior is normal.  Nursing note and vitals reviewed.     Assessment & Plan:   Problem List Items Addressed This Visit    None  Visit Diagnoses    Vaginal irritation    -  Primary    Relevant Medications    Homeopathic Products (HYLAFEM) SUPP    fluconazole (DIFLUCAN) 150 MG tablet    Acute rhinosinusitis        Relevant Medications    fluconazole (DIFLUCAN) 150 MG tablet    benzonatate (TESSALON) 200 MG capsule    fluticasone (FLONASE) 50 MCG/ACT nasal spray        Follow up plan: Return in about 4 weeks (around 12/21/2015), or if symptoms worsen or fail to improve, for labs and  screening exam.  Counseling provided for all of the vaccine components No orders of the defined types were placed in this encounter.    Arville Care, MD [facility] 11/23/2015, 9:10 AM

## 2015-11-27 ENCOUNTER — Encounter: Payer: Self-pay | Admitting: Obstetrics and Gynecology

## 2015-11-27 ENCOUNTER — Ambulatory Visit (INDEPENDENT_AMBULATORY_CARE_PROVIDER_SITE_OTHER): Payer: No Typology Code available for payment source | Admitting: Obstetrics and Gynecology

## 2015-11-27 VITALS — BP 120/82 | Ht 66.5 in | Wt 148.0 lb

## 2015-11-27 DIAGNOSIS — N390 Urinary tract infection, site not specified: Secondary | ICD-10-CM | POA: Diagnosis not present

## 2015-11-27 DIAGNOSIS — A499 Bacterial infection, unspecified: Secondary | ICD-10-CM | POA: Diagnosis not present

## 2015-11-27 DIAGNOSIS — R399 Unspecified symptoms and signs involving the genitourinary system: Secondary | ICD-10-CM

## 2015-11-27 MED ORDER — ESTRADIOL 1 MG PO TABS: 1.0000 mg | ORAL_TABLET | Freq: Every day | ORAL | Status: DC

## 2015-11-27 MED ORDER — ESTRADIOL 0.1 MG/GM VA CREA: 1.5200 g | TOPICAL_CREAM | VAGINAL | Status: DC

## 2015-11-27 MED ORDER — TRAMADOL HCL 50 MG PO TABS: 50.0000 mg | ORAL_TABLET | Freq: Four times a day (QID) | ORAL | Status: DC | PRN

## 2015-11-27 MED ORDER — SULFAMETHOXAZOLE-TRIMETHOPRIM 400-80 MG PO TABS: 1.0000 | ORAL_TABLET | Freq: Two times a day (BID) | ORAL | Status: DC

## 2015-11-27 NOTE — Progress Notes (Signed)
Patient ID: [name] [name], female   DOB: 1966/05/23, 50 y.o.   MRN: [id number] Pt her today for recurrent BV, pain with intercourse and recurrent yeast infections. Pt states that this has been an ongoing problem for about 2 years.

## 2015-11-29 LAB — URINE CULTURE

## 2015-12-03 ENCOUNTER — Telehealth: Payer: Self-pay | Admitting: Obstetrics and Gynecology

## 2015-12-04 ENCOUNTER — Telehealth: Payer: Self-pay | Admitting: *Deleted

## 2015-12-04 ENCOUNTER — Other Ambulatory Visit: Payer: Self-pay | Admitting: Obstetrics and Gynecology

## 2015-12-04 DIAGNOSIS — N390 Urinary tract infection, site not specified: Principal | ICD-10-CM

## 2015-12-04 DIAGNOSIS — A499 Bacterial infection, unspecified: Secondary | ICD-10-CM

## 2015-12-04 MED ORDER — NITROFURANTOIN MONOHYD MACRO 100 MG PO CAPS: 100.0000 mg | ORAL_CAPSULE | Freq: Two times a day (BID) | ORAL | Status: DC

## 2015-12-04 NOTE — Telephone Encounter (Signed)
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  Unable to contact pt Sunday, will try again in am thru office.

## 2015-12-04 NOTE — Telephone Encounter (Signed)
Pt aware of change in antibiotic.

## 2015-12-04 NOTE — Progress Notes (Signed)
Patient ID: [name] [name], female   DOB: 06/06/1966, 50 y.o.   MRN: [id number]   [facility] Clinic Visit  Patient name: [name] [name] MRN [id number]  Date of birth: [birth year]  CC & HPI:  [name] [name] is a 50 y.o. female presenting today for recurrent BV and dyspareunia, recurrent yeast infxn, x 2 yrs.  ROS:  Not taking premarin  Pertinent History Reviewed:   Reviewed: Significant for BV not medically dx'd recently Medical         Past Medical History  Diagnosis Date  . Interstitial cystitis   . Depression   . Bipolar disorder [facility])                               Surgical Hx:    Past Surgical History  Procedure Laterality Date  . Abdominal hysterectomy    . Femur fracture surgery    . Tonsillectomy    . Cholecystectomy  2006   Medications: Reviewed & Updated - see associated section                       Current outpatient prescriptions:  .  albuterol (PROVENTIL HFA;VENTOLIN HFA) 108 (90 BASE) MCG/ACT inhaler, Inhale 2 puffs into the lungs every 6 (six) hours as needed for wheezing or shortness of breath., Disp: 1 Inhaler, Rfl: 0 .  benzonatate (TESSALON) 200 MG capsule, Take 1 capsule (200 mg total) by mouth 3 (three) times daily as needed for cough., Disp: 20 capsule, Rfl: 0 .  fluticasone (FLONASE) 50 MCG/ACT nasal spray, Place 1 spray into both nostrils 2 (two) times daily as needed for allergies or rhinitis., Disp: 16 g, Rfl: 6 .  buPROPion (WELLBUTRIN XL) 150 MG 24 hr tablet, Take 1 tablet (150 mg total) by mouth daily. (Patient not taking: Reported on 11/27/2015), Disp: 30 tablet, Rfl: 1 .  estradiol (ESTRACE VAGINAL) 0.1 MG/GM vaginal cream, Place 0.25 Applicatorfuls vaginally 3 (three) times a week., Disp: 42.5 g, Rfl: 12 .  estradiol (ESTRACE) 1 MG tablet, Take 1 tablet (1 mg total) by mouth daily., Disp: 30 tablet, Rfl: 11 .  estrogens, conjugated, (PREMARIN) 0.625 MG tablet, Take 1 tablet (0.625 mg total) by mouth daily. Take daily for 21  days then do not take for 7 days. (Patient not taking: Reported on 11/27/2015), Disp: 30 tablet, Rfl: 5 .  Homeopathic Products (HYLAFEM) SUPP, Place 1 suppository vaginally daily. (Patient not taking: Reported on 11/27/2015), Disp: 6 suppository, Rfl: 0 .  nitrofurantoin, macrocrystal-monohydrate, (MACROBID) 100 MG capsule, Take 1 capsule (100 mg total) by mouth 2 (two) times daily. For uti, Disp: 14 capsule, Rfl: 0 .  traMADol (ULTRAM) 50 MG tablet, Take 1 tablet (50 mg total) by mouth every 6 (six) hours as needed., Disp: 60 tablet, Rfl: 0   Social History: Reviewed -  reports that she has been smoking Cigarettes.  She has a 16.5 pack-year smoking history. She has never used smokeless tobacco.  Objective Findings:  Vitals: Blood pressure 120/82, height 5' 6.5" (1.689 m), weight 148 lb (67.132 kg).  Physical Examination: General appearance - alert, well appearing, and in no distress and oriented to person, place, and time Eyes - pupils equal and reactive, extraocular eye movements intact Abdomen - soft, nontender, nondistended, no masses or organomegaly Pelvic - VULVA: normal appearing vulva with no masses, tenderness or lesions, VAGINA: normal appearing vagina with normal color and discharge, no  lesions, atrophic, PELVIC FLOOR EXAM: no cystocele, rectocele or prolapse noted, CERVIX: surgically absent, UTERUS: surgically absent, vaginal cuff well healed, tenderness of cuff short length , ADNEXA: normal adnexa in size, nontender and no masses, no masses, WET MOUNT done - results: KOH done, clue cells, occasional, exam chaperoned by amt   Assessment & Plan:   A:  1. Atrophic vaginitis 2  uti   P:  1. Estrace VC 2 Septra DS for uti

## 2015-12-04 NOTE — Telephone Encounter (Signed)
-----   Message from Tilda Burrow, MD sent at 12/04/2015  5:10 AM EST ----- uti returned resistant to septra, Rx changed to Macrobid (S), unable to contact pt over weekend.

## 2015-12-25 ENCOUNTER — Ambulatory Visit: Payer: No Typology Code available for payment source | Admitting: Family Medicine

## 2015-12-26 ENCOUNTER — Encounter: Payer: Self-pay | Admitting: Family Medicine

## 2016-01-08 ENCOUNTER — Encounter: Payer: Self-pay | Admitting: Obstetrics and Gynecology

## 2016-01-08 ENCOUNTER — Ambulatory Visit: Payer: No Typology Code available for payment source | Admitting: Obstetrics and Gynecology

## 2016-02-15 ENCOUNTER — Encounter (INDEPENDENT_AMBULATORY_CARE_PROVIDER_SITE_OTHER): Payer: Self-pay

## 2016-02-15 ENCOUNTER — Ambulatory Visit (INDEPENDENT_AMBULATORY_CARE_PROVIDER_SITE_OTHER): Payer: BLUE CROSS/BLUE SHIELD | Admitting: Family

## 2016-02-15 ENCOUNTER — Encounter: Payer: Self-pay | Admitting: Family

## 2016-02-15 VITALS — BP 121/83 | HR 82 | Temp 97.8°F | Ht 66.5 in | Wt 133.0 lb

## 2016-02-15 DIAGNOSIS — N898 Other specified noninflammatory disorders of vagina: Secondary | ICD-10-CM | POA: Diagnosis not present

## 2016-02-15 DIAGNOSIS — A499 Bacterial infection, unspecified: Secondary | ICD-10-CM | POA: Diagnosis not present

## 2016-02-15 DIAGNOSIS — N760 Acute vaginitis: Secondary | ICD-10-CM

## 2016-02-15 DIAGNOSIS — R1030 Lower abdominal pain, unspecified: Secondary | ICD-10-CM | POA: Diagnosis not present

## 2016-02-15 DIAGNOSIS — B3731 Acute candidiasis of vulva and vagina: Secondary | ICD-10-CM

## 2016-02-15 DIAGNOSIS — B373 Candidiasis of vulva and vagina: Secondary | ICD-10-CM

## 2016-02-15 DIAGNOSIS — B9689 Other specified bacterial agents as the cause of diseases classified elsewhere: Secondary | ICD-10-CM

## 2016-02-15 LAB — WET PREP FOR TRICH, YEAST, CLUE
Clue Cell Exam: POSITIVE — AB
Trichomonas Exam: NEGATIVE
Yeast Exam: NEGATIVE

## 2016-02-15 MED ORDER — FLUCONAZOLE 150 MG PO TABS: 150.0000 mg | ORAL_TABLET | Freq: Once | ORAL | Status: DC

## 2016-02-15 MED ORDER — METRONIDAZOLE 500 MG PO TABS: 500.0000 mg | ORAL_TABLET | Freq: Two times a day (BID) | ORAL | Status: DC

## 2016-02-15 MED ORDER — ESTRADIOL 1 MG PO TABS: 1.0000 mg | ORAL_TABLET | Freq: Every day | ORAL | Status: DC

## 2016-02-15 MED ORDER — HYDROCODONE-ACETAMINOPHEN 5-325 MG PO TABS: 1.0000 | ORAL_TABLET | Freq: Two times a day (BID) | ORAL | Status: DC | PRN

## 2016-02-15 NOTE — Patient Instructions (Signed)

## 2016-02-15 NOTE — Progress Notes (Signed)
Subjective:    Patient ID: [name] [name], female    DOB: May 01, 1966, 50 y.o.   MRN: [id number]  Pt presents to the office today for recurrent vaginal discharge/BV. Pt is followed by gyn who is treating her, but states her next appt is 2 weeks and she can not wait until then.  PT states she is having lower abd pain and has had endometriosis in the past, but has been told she had "scar tissue" there. Pt states she is having abd pain of 7 out 10. Pt states the pain is constant, but is worse when she is working. PT states her job makes her "push and pull" a lot and the pain is worse.  Vaginal Discharge The patient's primary symptoms include a genital odor and vaginal discharge. This is a recurrent problem. The current episode started 1 to 4 weeks ago. The problem occurs constantly. The problem has been unchanged. The patient is experiencing no pain. Associated symptoms include back pain, dysuria, frequency, nausea and urgency. Pertinent negatives include no constipation, diarrhea, discolored urine, headaches or vomiting. The vaginal discharge was brown. There has been no bleeding.      Review of Systems  Constitutional: Negative.   HENT: Negative.   Eyes: Negative.   Respiratory: Negative.  Negative for shortness of breath.   Cardiovascular: Negative.  Negative for palpitations.  Gastrointestinal: Positive for nausea. Negative for vomiting, diarrhea and constipation.  Endocrine: Negative.   Genitourinary: Positive for dysuria, urgency, frequency and vaginal discharge.  Musculoskeletal: Positive for back pain.  Neurological: Negative.  Negative for headaches.  Hematological: Negative.   Psychiatric/Behavioral: Negative.   All other systems reviewed and are negative.      Objective:   Physical Exam  Constitutional: She is oriented to person, place, and time. She appears well-developed and well-nourished. No distress.  HENT:  Head: Normocephalic and atraumatic.  Eyes: Pupils are  equal, round, and reactive to light.  Neck: Normal range of motion. Neck supple. No thyromegaly present.  Cardiovascular: Normal rate, regular rhythm, normal heart sounds and intact distal pulses.   No murmur heard. Pulmonary/Chest: Effort normal and breath sounds normal. No respiratory distress. She has no wheezes.  Abdominal: Soft. Bowel sounds are normal. She exhibits no distension. There is no tenderness.  Musculoskeletal: Normal range of motion. She exhibits no edema or tenderness.  Neurological: She is alert and oriented to person, place, and time.  Skin: Skin is warm and dry.  Psychiatric: She has a normal mood and affect. Her behavior is normal. Judgment and thought content normal.  Vitals reviewed.   BP 121/83 mmHg  Pulse 82  Temp(Src) 97.8 F (36.6 C) (Oral)  Ht 5' 6.5" (1.689 m)  Wt 133 lb (60.328 kg)  BMI 21.15 kg/m2       Assessment & Plan:  1. Vaginal discharge - WET PREP FOR TRICH, YEAST, CLUE - estradiol (ESTRACE) 1 MG tablet; Take 1 tablet (1 mg total) by mouth daily.  Dispense: 30 tablet; Refill: 11  2. BV (bacterial vaginosis) -Keep clean and dry -Continue probiotics -Keep follow up appt with Gyn - metroNIDAZOLE (FLAGYL) 500 MG tablet; Take 1 tablet (500 mg total) by mouth 2 (two) times daily.  Dispense: 14 tablet; Refill: 0 - estradiol (ESTRACE) 1 MG tablet; Take 1 tablet (1 mg total) by mouth daily.  Dispense: 30 tablet; Refill: 11  3. Vagina, candidiasis - fluconazole (DIFLUCAN) 150 MG tablet; Take 1 tablet (150 mg total) by mouth once.  Dispense: 1 tablet; Refill:  0  4. Lower abdominal pain -Pt needs to keep follow up appt with GYN - HYDROcodone-acetaminophen (NORCO/VICODIN) 5-325 MG tablet; Take 1 tablet by mouth every 12 (twelve) hours as needed for moderate pain.  Dispense: 28 tablet; Refill: 0  Jannifer Rodney, FNP

## 2016-02-23 ENCOUNTER — Ambulatory Visit (INDEPENDENT_AMBULATORY_CARE_PROVIDER_SITE_OTHER): Payer: BLUE CROSS/BLUE SHIELD | Admitting: Obstetrics and Gynecology

## 2016-02-23 ENCOUNTER — Encounter: Payer: Self-pay | Admitting: Obstetrics and Gynecology

## 2016-02-23 VITALS — BP 114/74 | HR 68 | Wt 134.4 lb

## 2016-02-23 DIAGNOSIS — B9689 Other specified bacterial agents as the cause of diseases classified elsewhere: Secondary | ICD-10-CM

## 2016-02-23 DIAGNOSIS — R300 Dysuria: Secondary | ICD-10-CM | POA: Diagnosis not present

## 2016-02-23 DIAGNOSIS — N898 Other specified noninflammatory disorders of vagina: Secondary | ICD-10-CM | POA: Diagnosis not present

## 2016-02-23 DIAGNOSIS — N760 Acute vaginitis: Secondary | ICD-10-CM | POA: Diagnosis not present

## 2016-02-23 DIAGNOSIS — R1030 Lower abdominal pain, unspecified: Secondary | ICD-10-CM

## 2016-02-23 DIAGNOSIS — A499 Bacterial infection, unspecified: Secondary | ICD-10-CM

## 2016-02-23 LAB — POCT WET PREP (WET MOUNT): CLUE CELLS WET PREP WHIFF POC: NEGATIVE

## 2016-02-23 MED ORDER — HYDROCODONE-ACETAMINOPHEN 5-325 MG PO TABS: 1.0000 | ORAL_TABLET | Freq: Two times a day (BID) | ORAL | Status: DC | PRN

## 2016-02-23 MED ORDER — METRONIDAZOLE 500 MG PO TABS: 500.0000 mg | ORAL_TABLET | Freq: Two times a day (BID) | ORAL | Status: DC

## 2016-02-23 MED ORDER — ESTRADIOL 1 MG PO TABS: 1.0000 mg | ORAL_TABLET | Freq: Every day | ORAL | Status: DC
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
## 2016-02-24 LAB — URINE CULTURE: ORGANISM ID, BACTERIA: NO GROWTH

## 2016-02-26 ENCOUNTER — Other Ambulatory Visit: Payer: Self-pay | Admitting: Family Medicine

## 2016-02-26 NOTE — Telephone Encounter (Signed)
Patient needs to be seen because of the recurrent nature of her UTIs we will need to collect the sample and dry culture.

## 2016-02-27 LAB — GC/CHLAMYDIA PROBE AMP
Chlamydia trachomatis, NAA: NEGATIVE
Neisseria gonorrhoeae by PCR: NEGATIVE

## 2016-02-27 NOTE — Telephone Encounter (Signed)
Patient aware she will need to be seen, she said she will call back and make an appointment

## 2016-03-01 ENCOUNTER — Telehealth: Payer: Self-pay | Admitting: Obstetrics and Gynecology

## 2016-03-04 ENCOUNTER — Telehealth: Payer: Self-pay | Admitting: Obstetrics and Gynecology

## 2016-03-04 MED ORDER — SULFAMETHOXAZOLE-TRIMETHOPRIM 800-160 MG PO TABS: 1.0000 | ORAL_TABLET | Freq: Two times a day (BID) | ORAL | Status: DC

## 2016-03-04 NOTE — Telephone Encounter (Signed)
I spoke with Dr. Emelda FearFerguson and he gave a verbal order for septra ds bid x 7 days, note written and will fax. Pt wanted confirmation in the morning

## 2016-03-04 NOTE — Telephone Encounter (Signed)
Spoke with the pt and Dr. Emelda FearFerguson. Ok to give note per Dr. Emelda FearFerguson.

## 2016-03-04 NOTE — Telephone Encounter (Signed)
Pt called stating that she needs something called in to her pharmacy for her really bad UTI and pt states that she needs a letter for work because she missed fri,sat and sun

## 2016-03-04 NOTE — Telephone Encounter (Signed)
Pt states that she is having a lot of pain and is not getting any relief. Pt states that she still has the UTI and the bladder infection. Pt states that her urine is dark dark brown. Pt states that she can't pay for a visit, until she can get paid. Pt states that she is swollen and hurts so bad.   Pt needs a [facility]ed [facility] [phone] in HR. Pt states that she would like the note to say due abdominal pain.

## 2016-03-05 NOTE — Telephone Encounter (Signed)
[name] [name] from HR called and stated that the dates on the letter were not the right dates. The dates were changed and a new note was written and faxed to the pt's HR.

## 2016-03-06 NOTE — Telephone Encounter (Signed)
Pt aware that note was faxed to her employer

## 2016-03-14 ENCOUNTER — Telehealth: Payer: Self-pay | Admitting: *Deleted

## 2016-03-14 NOTE — Telephone Encounter (Signed)
Left message advising pt to schedule an appt due to possible infection. JSY

## 2016-03-15 ENCOUNTER — Other Ambulatory Visit: Payer: Self-pay | Admitting: Obstetrics and Gynecology

## 2016-03-15 DIAGNOSIS — N952 Postmenopausal atrophic vaginitis: Secondary | ICD-10-CM

## 2016-03-15 MED ORDER — ESTRADIOL 0.1 MG/GM VA CREA: 0.2500 | TOPICAL_CREAM | VAGINAL | Status: DC

## 2016-03-21 ENCOUNTER — Telehealth: Payer: Self-pay | Admitting: *Deleted

## 2016-03-21 ENCOUNTER — Telehealth: Payer: Self-pay | Admitting: Family Medicine

## 2016-03-21 DIAGNOSIS — B3731 Acute candidiasis of vulva and vagina: Secondary | ICD-10-CM

## 2016-03-21 DIAGNOSIS — N390 Urinary tract infection, site not specified: Principal | ICD-10-CM

## 2016-03-21 DIAGNOSIS — N760 Acute vaginitis: Secondary | ICD-10-CM

## 2016-03-21 DIAGNOSIS — B9689 Other specified bacterial agents as the cause of diseases classified elsewhere: Secondary | ICD-10-CM

## 2016-03-21 DIAGNOSIS — B373 Candidiasis of vulva and vagina: Secondary | ICD-10-CM

## 2016-03-21 DIAGNOSIS — A499 Bacterial infection, unspecified: Secondary | ICD-10-CM

## 2016-03-21 MED ORDER — FLUCONAZOLE 150 MG PO TABS: 150.0000 mg | ORAL_TABLET | Freq: Once | ORAL | Status: DC

## 2016-03-21 MED ORDER — METRONIDAZOLE 500 MG PO TABS: 500.0000 mg | ORAL_TABLET | Freq: Two times a day (BID) | ORAL | Status: DC

## 2016-03-21 NOTE — Telephone Encounter (Signed)
Referral made 

## 2016-03-21 NOTE — Telephone Encounter (Signed)
Pt called stating that she is pissed cause Dr. Emelda FearFerguson sent in a cream and she needs a pill and she has paid $25 for medication that she don't even need. Informed the pt that the reason he called in the estrace cream was due to her complaint at her last visit of pain with intercourse. Pt states that she needs something today for the problem. Pt states that she continues to have this problem and doesn't understand why. Pt states that she has an appointment tomorrow but needs something today for the BV "cottage cheese looking" discharge she is having.   I spoke with Dr. Emelda FearFerguson and he advised the pt should keep her appointment for tomorrow but an rx for Diflucan 150mg  take one tablet now repeat in three days if needed.    I will call and notify the pt of this.   Pt was very upset and states that she knows that she has BV and that she just took a Diflucan 6 days ago and she really needs something to take care of this problem.   I spoke with Dr. Emelda FearFerguson again and he gave a verbal order for Flagyl and pt is aware that both medications are at her pharmacy. Pt states that she will keep her appointment tomorrow but will sign records release to [facility] [place].

## 2016-03-22 ENCOUNTER — Ambulatory Visit: Payer: No Typology Code available for payment source | Admitting: Family Medicine

## 2016-03-22 ENCOUNTER — Encounter: Payer: Self-pay | Admitting: Obstetrics and Gynecology

## 2016-03-22 ENCOUNTER — Ambulatory Visit: Payer: No Typology Code available for payment source | Admitting: Obstetrics and Gynecology

## 2016-03-27 ENCOUNTER — Encounter: Payer: Self-pay | Admitting: Family Medicine

## 2017-02-05 ENCOUNTER — Emergency Department (HOSPITAL_COMMUNITY)
Admission: EM | Admit: 2017-02-05 | Discharge: 2017-02-05 | Disposition: A | Discharge status: Home / Self Care | Payer: BLUE CROSS/BLUE SHIELD | Attending: Emergency Medicine | Admitting: Emergency Medicine

## 2017-02-05 ENCOUNTER — Encounter (HOSPITAL_COMMUNITY): Payer: Self-pay | Admitting: *Deleted

## 2017-02-05 DIAGNOSIS — F17210 Nicotine dependence, cigarettes, uncomplicated: Secondary | ICD-10-CM | POA: Insufficient documentation

## 2017-02-05 DIAGNOSIS — Z79899 Other long term (current) drug therapy: Secondary | ICD-10-CM | POA: Insufficient documentation

## 2017-02-05 DIAGNOSIS — F419 Anxiety disorder, unspecified: Secondary | ICD-10-CM | POA: Diagnosis present

## 2017-02-05 LAB — COMPREHENSIVE METABOLIC PANEL
ALBUMIN: 4.6 g/dL (ref 3.5–5.0)
ALK PHOS: 47 U/L (ref 38–126)
ALT: 18 U/L (ref 14–54)
AST: 19 U/L (ref 15–41)
Anion gap: 8 (ref 5–15)
BUN: 14 mg/dL (ref 6–20)
CALCIUM: 10.5 mg/dL — AB (ref 8.9–10.3)
CHLORIDE: 105 mmol/L (ref 101–111)
CO2: 26 mmol/L (ref 22–32)
CREATININE: 0.7 mg/dL (ref 0.44–1.00)
GFR calc non Af Amer: >60 mL/min (ref >60)
Glucose, Bld: 85 mg/dL (ref 65–99)
Potassium: 4.5 mmol/L (ref 3.5–5.1)
Sodium: 139 mmol/L (ref 135–145)
Total Bilirubin: 0.5 mg/dL (ref 0.3–1.2)
Total Protein: 7.5 g/dL (ref 6.5–8.1)

## 2017-02-05 LAB — PREGNANCY, URINE: PREG TEST UR: NEGATIVE

## 2017-02-05 LAB — CBC WITH DIFFERENTIAL/PLATELET
Basophils Absolute: 0 10*3/uL (ref 0.0–0.1)
Basophils Relative: 0 %
EOS PCT: 2 %
Eosinophils Absolute: 0.1 10*3/uL (ref 0.0–0.7)
HEMATOCRIT: 42.8 % (ref 36.0–46.0)
Hemoglobin: 15 g/dL (ref 12.0–15.0)
LYMPHS ABS: 2.3 10*3/uL (ref 0.7–4.0)
LYMPHS PCT: 29 %
MCH: 31.8 pg (ref 26.0–34.0)
MCHC: 35 g/dL (ref 30.0–36.0)
MCV: 90.7 fL (ref 78.0–100.0)
MONO ABS: 0.3 10*3/uL (ref 0.1–1.0)
MONOS PCT: 3 %
Neutro Abs: 5.2 10*3/uL (ref 1.7–7.7)
Neutrophils Relative %: 66 %
Platelets: 210 10*3/uL (ref 150–400)
RBC: 4.72 MIL/uL (ref 3.87–5.11)
RDW: 12.6 % (ref 11.5–15.5)
WBC: 8 10*3/uL (ref 4.0–10.5)

## 2017-02-05 LAB — SALICYLATE LEVEL

## 2017-02-05 LAB — CK: Total CK: 39 U/L (ref 38–234)

## 2017-02-05 LAB — ACETAMINOPHEN LEVEL: Acetaminophen (Tylenol), Serum: <10 ug/mL — ABNORMAL LOW (ref 10–30)

## 2017-02-05 LAB — RAPID URINE DRUG SCREEN, HOSP PERFORMED
Amphetamines: NOT DETECTED
BARBITURATES: NOT DETECTED
BENZODIAZEPINES: POSITIVE — AB
COCAINE: NOT DETECTED
OPIATES: NOT DETECTED
Tetrahydrocannabinol: NOT DETECTED

## 2017-02-05 LAB — ETHANOL: Alcohol, Ethyl (B): <5 mg/dL (ref <5)

## 2017-02-05 MED ORDER — HYDROXYZINE HCL 25 MG PO TABS: ORAL_TABLET | ORAL | 0 refills | Status: AC

## 2017-02-05 MED ORDER — HYDROXYZINE HCL 25 MG PO TABS
50.0000 mg | ORAL_TABLET | Freq: Once | ORAL | Status: AC
Start: 2017-02-05 — End: 2017-02-05
  Administered 2017-02-05: 50 mg via ORAL
  Filled 2017-02-05: qty 2

## 2017-02-05 NOTE — ED Triage Notes (Signed)
Pt comes in with finance who states pt has been feeling anxious and having pain all over. This started last Friday. Pt denies any n/v/d or pain in any specific location. Pt is anxious upon triage, she is shaking and bouncing knee continually. Pt advised to slow breathing.

## 2017-02-05 NOTE — Discharge Instructions (Signed)
Take the prescription as directed.  Call your regular medical doctor and the mental health resources given to you today to schedule a follow up appointment within the week.  Return to the Emergency Department immediately sooner if worsening.    Substance Abuse Treatment Programs  Intensive Outpatient Programs [facility]     [address]      [place], [place]                   [phone]       [facility] [address] [place], [place] [phone]  [facility]     (Inpatient and outpatient)     [address]           [phone]    [facility] [phone] (Suboxone and Methadone)  [address]      [place], [place] [address]      [phone]       [address] [place], [place] [phone]  [facility] (Outpatient/Inpatient, Chemical)    (insurance only) [phone]             [facility] (Groups & Residential) [place], [place] [phone]     [facility]     [address]     [place], [place]      [phone]       [facility] (for caregivers and family) [phone] [place], [place] [phone]      Residential Treatment Programs [facility]      [address] [place], [place] [address]  [phone]       [facility] [address], [place], [place] [address] [phone]  [facility] [place]        [phone]       [facility] [phone]  [facility] ([facility]             [address]                                         [place], [place]                                                [phone] or [phone]                               [facility] [place] [address] [place], [address] [phone]  [facility]    [address]      [place], [place]     [phone]       [facility] [address] [place], [place] [phone]  [facility]   [address] [place], [place] [address]     [phone]      Admissions: 8am-3pm M-F  [facility] (RTS) [address] [place], [place] [phone]  [facility]: Residential Program [phone] Days)   [place], [place]      [phone] or [phone]     [facility]: [facility], [place] (Walk in Hours over the weekend or by referral)  [facility] [address] [place], [place], [place] [address] [phone]  Crisis Mobile: [facility]:  [phone] (for crisis response 24 hours a day) [facility] Hotline:      [phone] Outpatient Psychiatry and Counseling  [facility]: Mobile Crisis Management 24 hours:  [phone]  [facility] of the [employer] sliding scale fee and walk in schedule: M-F 8am-12pm/1pm-3pm [address]  [place], [place] [address] [phone]  [facility] [address] [place], [place] [address] [phone]  [facility] ([facility] as [facility] [employer])- new patient walk-in appointments available Monday - Friday 8am -3pm.          [address] [place], [place] [address] [phone] or crisis line- [phone]  [facility] Outpatient Services/ Intensive Outpatient Therapy Program [address] [place], [place] [address] [phone]  [facility] Mental Health                  Crisis Services      [phone]     [place], [place] [address]                 [facility]   [facility] [phone]. [address] [place], [place] [address]   [employer] of Care          [address] Bea Laura  [place], [place] [address]       [phone]  [facility] [address] [place], [place] [address] [phone]  [facility]    [address]    [place], [place] [address]     [phone]       Andee Poles, MD     [address]     [place] [address]     [phone]       [facility] [address] [place] [address]  [facility]     [address] [place], [place]      [phone]       [facility] Eulogio Ditch, MD [address] [place], [place] [address] [phone]  [facility]     [address]     [place], [place] [address]     [phone]       [facility] [address] [place], [place] [address] [phone] Resources for Temporary Residential Assistance/Crisis Centers  DAY CENT[facility]) M-F 8am-3pm   [address] [place], [place] [address]   [phone] Services include: laundry, barbering, support groups, case management, phone  & computer access, showers, AA/NA mtgs, mental health/substance abuse nurse, job skills class, disability information, VA assistance, spiritual classes, etc.   HOMELESS SHELTERS  [facility] Ministry     [facility]   [address] [place]     [phone]              [employer] (women and children)       [address] [place], [place] [address] [phone] [email or web address] for application and process Application Required  [facility] [employer] [facility]   [address]    [place] [address]     [phone]                    [facility]  [address] [place], [place] [address] [phone] application appt.) Application Required  [facility] (women only)    [address]     [place], [place] [address]     [phone]      Intake starts 6pm daily Need valid ID, SSC, & Police report [employer] [address] [place], [place] [phone] Application  Required  [employer] (men only)     [address]      [place], [place]     [phone]       [facility] (Pregnant women only) [address] [place], [place] [phone]  [facility]      [address]      [place], [place] [address]     [phone]             [facility] [address] [place], [place] [phone] 90 day commitment/SA/Application process  [facility](men only)     [address]     [place], [place]     [phone]       Check-in at [facility] [address] [place], [place] [address] [phone] Men/Women/Women and Children must be there by 7 pm  [facility] [place], [place] [phone]

## 2017-02-05 NOTE — ED Notes (Signed)
ED Provider at bedside. 

## 2017-02-05 NOTE — ED Notes (Signed)
Pt very anxious. Cannot sit still. Pt states " I fee like bee are stinging me all over" HX of panic attacks and anxiety.

## 2017-02-05 NOTE — ED Provider Notes (Signed)
[facility] DEPT Provider Note   CSN: [id number] Arrival date & time: 02/05/17  1135     History   Chief Complaint Chief Complaint  Patient presents with  . Anxiety    HPI [name] [name] is a 51 y.o. female.  HPI  Pt was seen at 1255. Per pt, c/o gradual onset and persistence of constant "anxiety" and "panic attacks" for the past 5 days. Pt states her symptoms began when she was walking around at work. Pt states she is anxious regarding taking care of her elderly parents and young grandson. Pt states she has been taking OTC benadryl without relief. States she "needs some medicine for home and then I can follow up with someone." States she "used to be on" multiple psych meds. Denies CP/palpitations, no SOB/cough, no change in chronic abd pain, no N/V/D, no SI, no HI, no hallucinations.   Past Medical History:  Diagnosis Date  . Bipolar disorder (HCC)   . Depression   . Interstitial cystitis     Patient Active Problem List   Diagnosis Date Noted  . UTI (urinary tract infection), bacterial 12/04/2015  . Bipolar disorder (HCC) 09/08/2015    Past Surgical History:  Procedure Laterality Date  . ABDOMINAL HYSTERECTOMY    . CHOLECYSTECTOMY  2006  . FEMUR FRACTURE SURGERY    . TONSILLECTOMY      OB History    No data available       Home Medications    Prior to Admission medications   Medication Sig Start Date End Date Taking? Authorizing Provider  amitriptyline (ELAVIL) 25 MG tablet Take 25-50 mg by mouth at bedtime.   Yes Historical Provider, MD  estrogens, conjugated, (PREMARIN) 1.25 MG tablet Take 1 tablet by mouth daily. 08/21/16  Yes Historical Provider, MD  hydrOXYzine (ATARAX/VISTARIL) 25 MG tablet Take 50 mg by mouth at bedtime. 07/23/16  Yes Historical Provider, MD  Multiple Vitamin (MULTIVITAMIN WITH MINERALS) TABS tablet Take 1 tablet by mouth daily.   Yes Historical Provider, MD  pentosan polysulfate (ELMIRON) 100 MG capsule Take 100 mg by mouth 3 (three)  times daily.   Yes Historical Provider, MD    Family History Family History  Problem Relation Age of Onset  . Hypertension Mother   . Hypertension Father   . Heart disease Father   . Diabetes Father   . Bipolar disorder Daughter   . ADD / ADHD Son     Social History Social History  Substance Use Topics  . Smoking status: Current Every Day Smoker    Packs/day: 0.50    Years: 33.00    Types: Cigarettes  . Smokeless tobacco: Never Used  . Alcohol use 0.0 oz/week     Comment: once per month     Allergies   Patient has no known allergies.   Review of Systems Review of Systems ROS: Statement: All systems negative except as marked or noted in the HPI; Constitutional: Negative for fever and chills. ; ; Eyes: Negative for eye pain, redness and discharge. ; ; ENMT: Negative for ear pain, hoarseness, nasal congestion, sinus pressure and sore throat. ; ; Cardiovascular: Negative for chest pain, palpitations, diaphoresis, dyspnea and peripheral edema. ; ; Respiratory: Negative for cough, wheezing and stridor. ; ; Gastrointestinal: Negative for nausea, vomiting, diarrhea, abdominal pain, blood in stool, hematemesis, jaundice and rectal bleeding. . ; ; Genitourinary: Negative for dysuria, flank pain and hematuria. ; ; Musculoskeletal: Negative for back pain and neck pain. Negative for swelling and trauma.; ;  Skin: Negative for pruritus, rash, abrasions, blisters, bruising and skin lesion.; ; Neuro: Negative for headache, lightheadedness and neck stiffness. Negative for weakness, altered level of consciousness, altered mental status, extremity weakness, paresthesias, involuntary movement, seizure and syncope.; Psych:  +anxiety, panic attacks. No SI, no SA, no HI, no hallucinations.      Physical Exam Updated Vital Signs BP 132/87   Pulse 71   Temp 98 F (36.7 C) (Oral)   Resp 18   Ht  (1.676 m)   Wt 137 lb (62.1 kg)   SpO2 100%   BMI 22.11 kg/m   Physical Exam 1300: Physical  examination:  Nursing notes reviewed; Vital signs and O2 SAT reviewed;  Constitutional: Well developed, Well nourished, Well hydrated, In no acute distress; Head:  Normocephalic, atraumatic; Eyes: EOMI, PERRL, No scleral icterus; ENMT: Mouth and pharynx normal, Mucous membranes moist; Neck: Supple, Full range of motion, No lymphadenopathy; Cardiovascular: Regular rate and rhythm, No gallop; Respiratory: Breath sounds clear & equal bilaterally, No wheezes.  Speaking full sentences with ease, Normal respiratory effort/excursion; Chest: Nontender, Movement normal; Abdomen: Soft, Nontender, Nondistended, Normal bowel sounds; Genitourinary: No CVA tenderness; Extremities: Pulses normal, No tenderness, No edema, No calf edema or asymmetry.; Neuro: AA&Ox3, Major CN grossly intact.  Speech clear. No gross focal motor or sensory deficits in extremities.; Skin: Color normal, Warm, Dry.; Psych:  Bouncing knee up and down. Endorses anxiety, denies SI.     ED Treatments / Results  Labs (all labs ordered are listed, but only abnormal results are displayed)   EKG  EKG Interpretation None       Radiology   Procedures Procedures (including critical care time)  Medications Ordered in ED Medications  hydrOXYzine (ATARAX/VISTARIL) tablet 50 mg (50 mg Oral Given 02/05/17 1317)     Initial Impression / Assessment and Plan / ED Course  I have reviewed the triage vital signs and the nursing notes.  Pertinent labs & imaging results that were available during my care of the patient were reviewed by me and considered in my medical decision making (see chart for details).  MDM Reviewed: previous chart, nursing note and vitals Reviewed previous: labs Interpretation: labs   Results for orders placed or performed during the hospital encounter of 02/05/17  Pregnancy, urine  Result Value Ref Range   Preg Test, Ur NEGATIVE NEGATIVE  Urine rapid drug screen (hosp performed)  Result Value Ref Range   Opiates  NONE DETECTED NONE DETECTED   Cocaine NONE DETECTED NONE DETECTED   Benzodiazepines POSITIVE (A) NONE DETECTED   Amphetamines NONE DETECTED NONE DETECTED   Tetrahydrocannabinol NONE DETECTED NONE DETECTED   Barbiturates NONE DETECTED NONE DETECTED  Acetaminophen level  Result Value Ref Range   Acetaminophen (Tylenol), Serum <10 (L) 10 - 30 ug/mL  Comprehensive metabolic panel  Result Value Ref Range   Sodium 139 135 - 145 mmol/L   Potassium 4.5 3.5 - 5.1 mmol/L   Chloride 105 101 - 111 mmol/L   CO2 26 22 - 32 mmol/L   Glucose, Bld 85 65 - 99 mg/dL   BUN 14 6 - 20 mg/dL   Creatinine, Ser 3.24 0.44 - 1.00 mg/dL   Calcium 40.1 (H) 8.9 - 10.3 mg/dL   Total Protein 7.5 6.5 - 8.1 g/dL   Albumin 4.6 3.5 - 5.0 g/dL   AST 19 15 - 41 U/L   ALT 18 14 - 54 U/L   Alkaline Phosphatase 47 38 - 126 U/L   Total Bilirubin 0.5  0.3 - 1.2 mg/dL   GFR calc non Af Amer >60 >60 mL/min   GFR calc Af Amer >60 >60 mL/min   Anion gap 8 5 - 15  Ethanol  Result Value Ref Range   Alcohol, Ethyl (B) <5 <5 mg/dL  Salicylate level  Result Value Ref Range   Salicylate Lvl <7.0 2.8 - 30.0 mg/dL  CBC with Differential  Result Value Ref Range   WBC 8.0 4.0 - 10.5 K/uL   RBC 4.72 3.87 - 5.11 MIL/uL   Hemoglobin 15.0 12.0 - 15.0 g/dL   HCT 16.1 09.6 - 04.5 %   MCV 90.7 78.0 - 100.0 fL   MCH 31.8 26.0 - 34.0 pg   MCHC 35.0 30.0 - 36.0 g/dL   RDW 40.9 81.1 - 91.4 %   Platelets 210 150 - 400 K/uL   Neutrophils Relative % 66 %   Neutro Abs 5.2 1.7 - 7.7 K/uL   Lymphocytes Relative 29 %   Lymphs Abs 2.3 0.7 - 4.0 K/uL   Monocytes Relative 3 %   Monocytes Absolute 0.3 0.1 - 1.0 K/uL   Eosinophils Relative 2 %   Eosinophils Absolute 0.1 0.0 - 0.7 K/uL   Basophils Relative 0 %   Basophils Absolute 0.0 0.0 - 0.1 K/uL  CK  Result Value Ref Range   Total CK 39 38 - 234 U/L     1415:  [place] Controlled Substance Database accessed: pt has had monthly #60 xanax prescribed by Dr. Marcelyn Bruins in [place], [place];  last refill was 11/23/16. Pt tells ED staff she has "run out of" this medication. UDS +benzos.  Pt given vistaril in the ED. When ED staff not looking at pt, she will stop shaking and bouncing her knees. Pt requesting "something else for my nerves." Pt informed she will not receive benzo while in the ED, nor rx for same; and she will need to f/u with her usual prescriber Dr. Logan Bores for this chronic controlled substance medication. Pt then stopped shaking herself and bouncing her knees. Stated she was ready to go home now. Dx and testing d/w pt.  Questions answered.  Verb understanding, agreeable to d/c home with outpt f/u.    Final Clinical Impressions(s) / ED Diagnoses   Final diagnoses:  None    New Prescriptions New Prescriptions   No medications on file     Samuel Jester, DO 02/08/17 2346

## 2017-08-13 IMAGING — CT CT ABD-PELV W/ CM
2 of 5 series · 16 of 46 positions shown, 18 images · IV contrast (Omnipaque 300)
Comparison: None.

CLINICAL DATA: Bilateral lower pelvic pain, chronic vaginitis with
low back pain, prior cholecystectomy and hysterectomy, diarrhea

EXAM:
CT ABDOMEN AND PELVIS WITH CONTRAST
TECHNIQUE: Multidetector CT imaging of the abdomen and pelvis was performed
using the standard protocol following bolus administration of
intravenous contrast.
CONTRAST:  100mL OMNIPAQUE IOHEXOL 300 MG/ML  SOLN

[Series 2: abd_pel_with 5.0 b40f · axial · 0.74mm/px · z∈[-431,-11]mm · 13 of 96 slices shown, 15 images]
[im 6/96  soft-tissue]
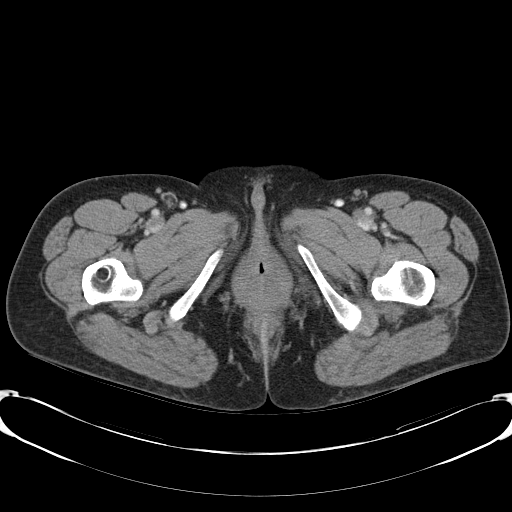
[im 6/96  bone]
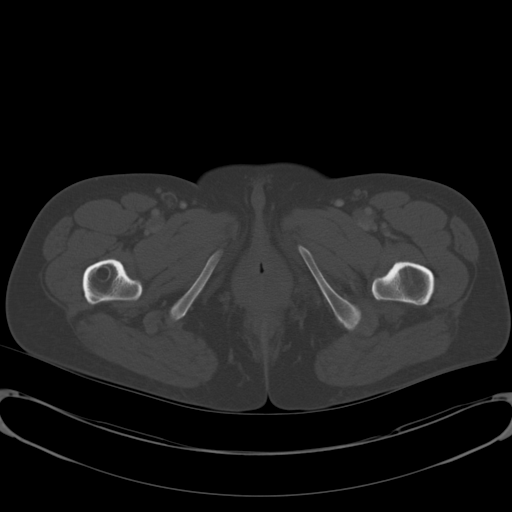
[im 11/96  soft-tissue]
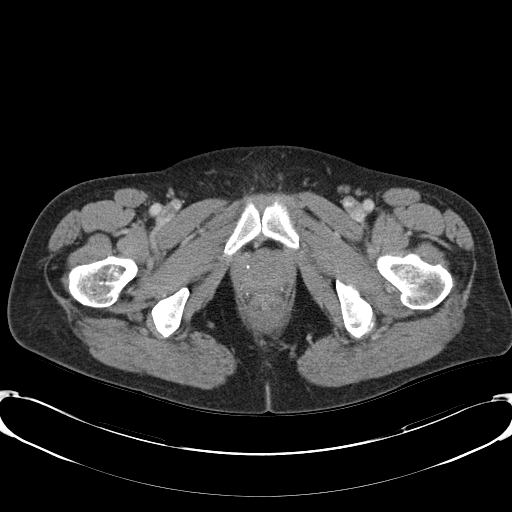
[im 22/96  soft-tissue]
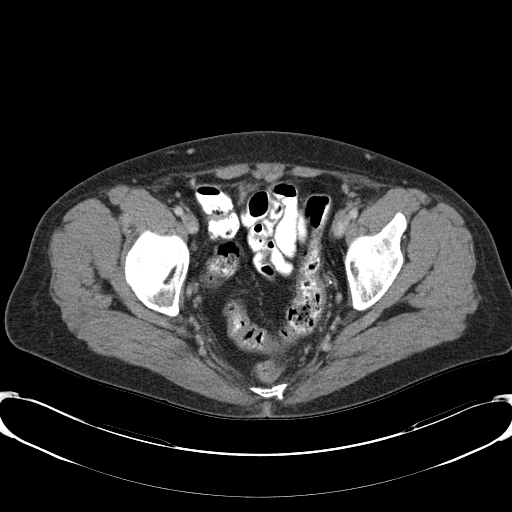
[im 27/96  soft-tissue]
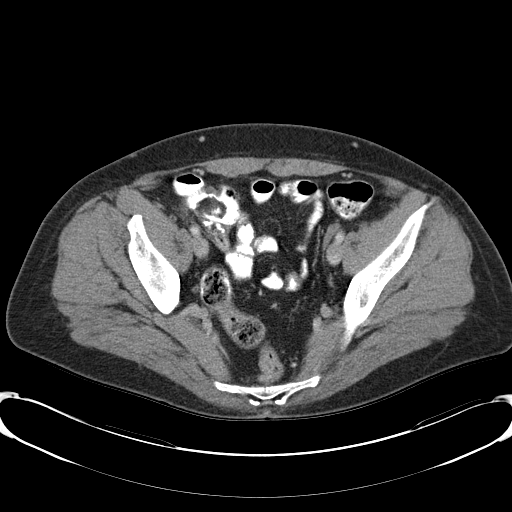
[im 32/96  soft-tissue]
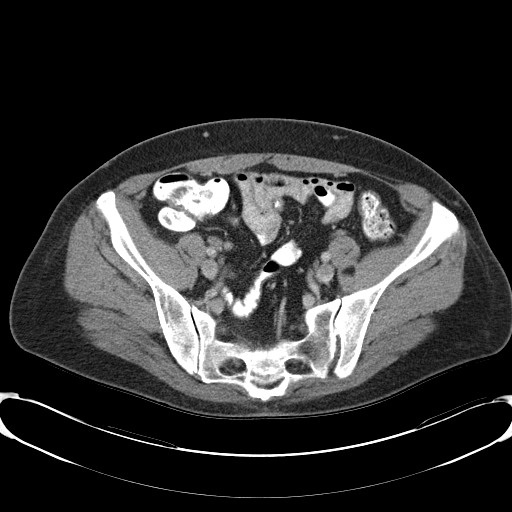
[im 43/96  soft-tissue]
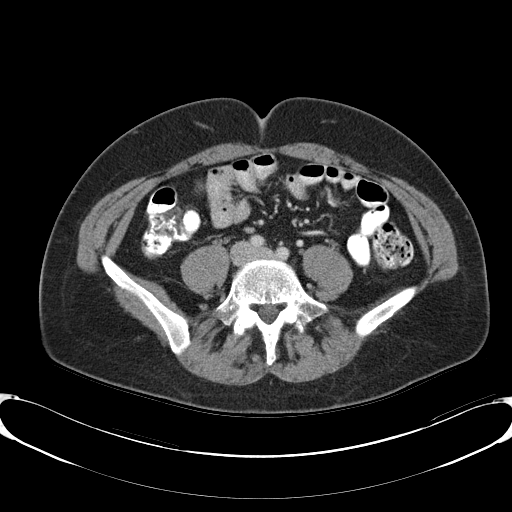
[im 48/96  soft-tissue]
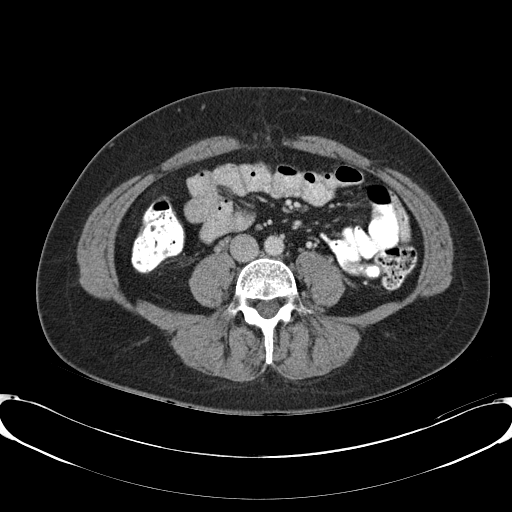
[im 53/96  soft-tissue]
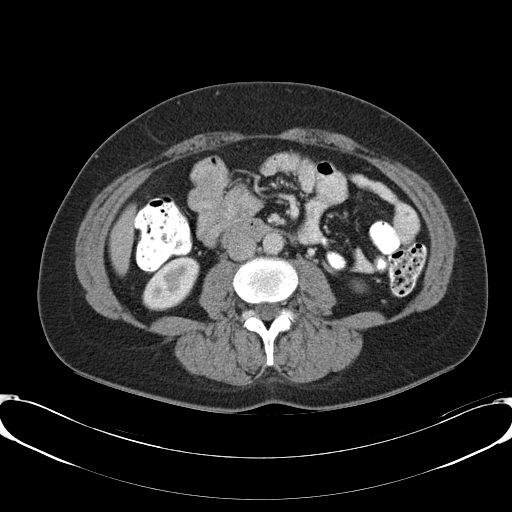
[im 64/96  soft-tissue]
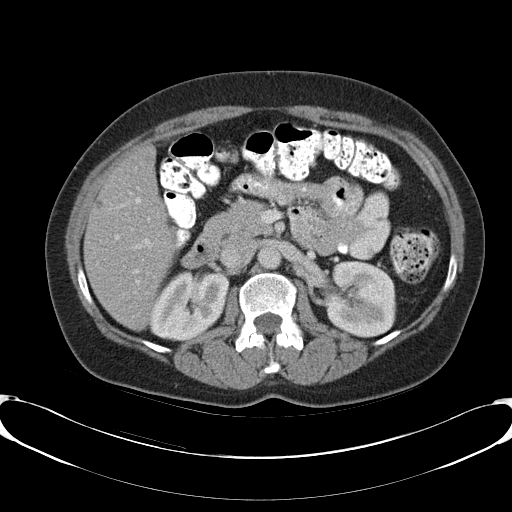
[im 64/96  bone]
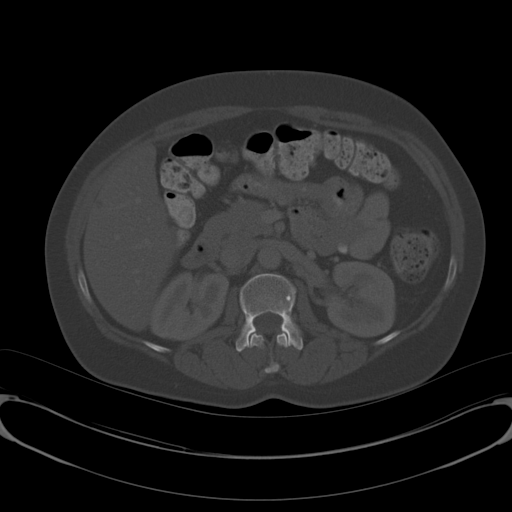
[im 69/96  soft-tissue]
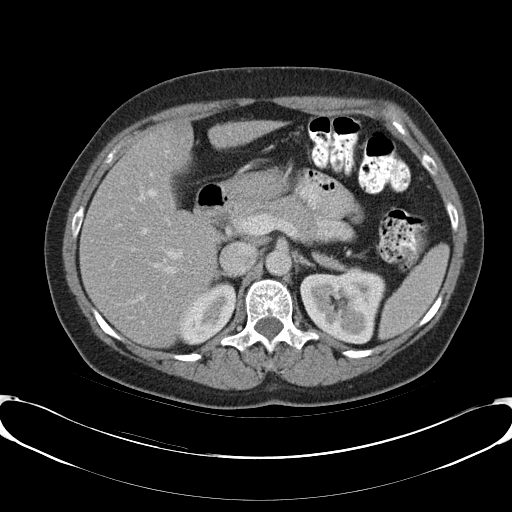
[im 74/96  soft-tissue]
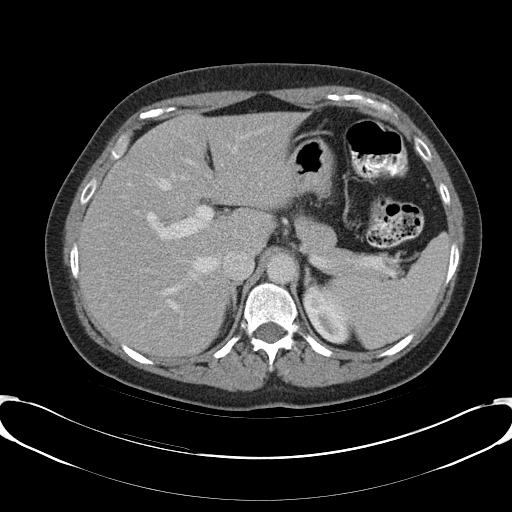
[im 85/96  soft-tissue]
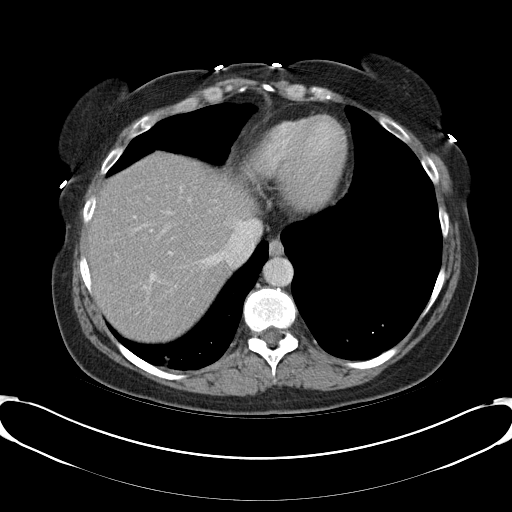
[im 90/96  soft-tissue]
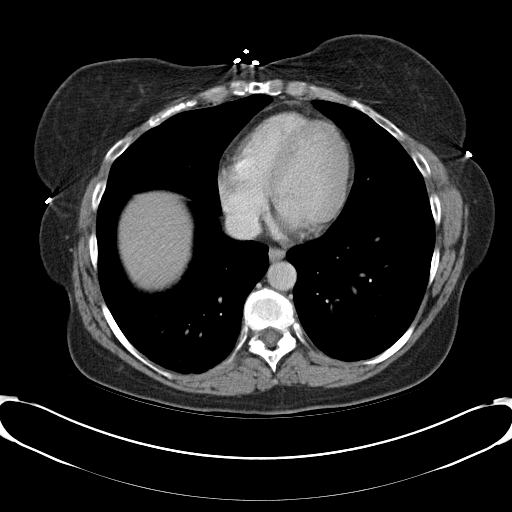

[Series 4: abd_pel_with 3.0 spo cor · coronal · 0.71mm/px · 3 of 92 slices shown]
[im 31/92  soft-tissue]
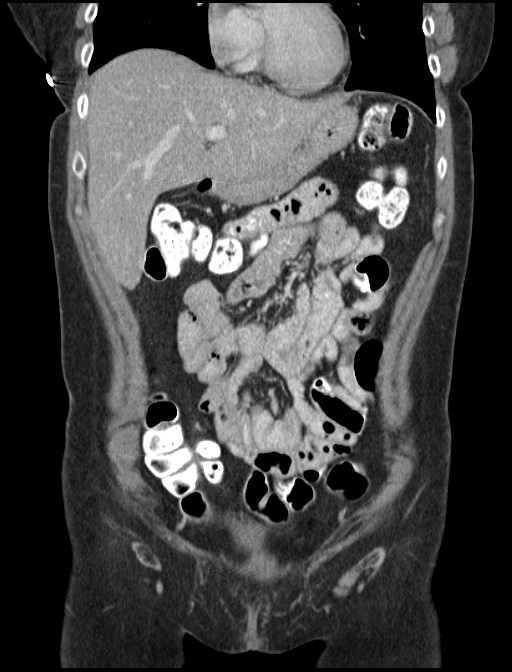
[im 41/92  soft-tissue]
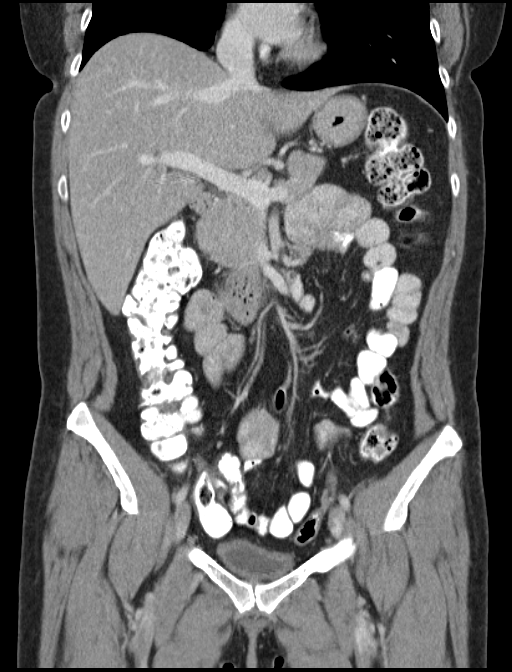
[im 51/92  soft-tissue]
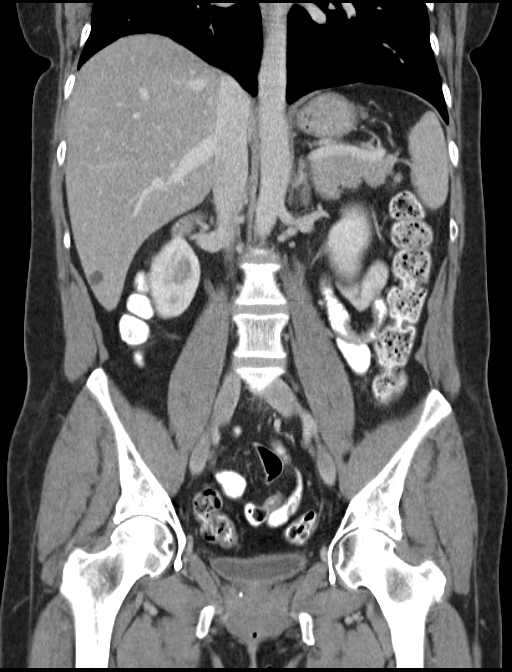

[16 of 46 positions shown; findings below may reference images not displayed]

FINDINGS: Lower chest:  Lung bases are clear.

Hepatobiliary: 5 mm hypoenhancing lesion inferiorly in the right
hepatic lobe (series 2/ image 34), likely benign. Additional 11 mm
cyst in segment 6 (series 2/ image 40).

Status post cholecystectomy. No intrahepatic or extrahepatic ductal
dilatation.

Pancreas: Within normal limits.

Spleen: Within normal limits.

Adrenals/Urinary Tract: Adrenal glands are within normal limits.

Kidneys within normal limits.  No hydronephrosis.

Bladder is mildly thick-walled although underdistended.

Stomach/Bowel: Stomach is within normal limits.

No evidence of bowel obstruction.

Normal appendix (series 2/ image 63).

Vascular/Lymphatic: No evidence of abdominal aortic aneurysm.

No suspicious abdominopelvic lymphadenopathy.

Reproductive: Status post hysterectomy.

Bilateral ovaries are within normal limits.

Other: No abdominopelvic ascites.

Musculoskeletal: Visualized osseous structures are within normal
limits.
IMPRESSION: No evidence of bowel obstruction.  Normal appendix.

Status post cholecystectomy and hysterectomy.

No CT findings to account for the patient's chronic pelvic pain.
